# Patient Record
Sex: Female | Born: 1995 | Race: White | Hispanic: No | Marital: Single | Smoking: Never smoker
Health system: Southern US, Community
[De-identification: ages and names within clinical notes are randomized; demographics above are authoritative.]

## PROBLEM LIST (undated history)

## (undated) DIAGNOSIS — N3941 Urge incontinence: Secondary | ICD-10-CM

## (undated) DIAGNOSIS — Z973 Presence of spectacles and contact lenses: Secondary | ICD-10-CM

## (undated) DIAGNOSIS — K219 Gastro-esophageal reflux disease without esophagitis: Secondary | ICD-10-CM

## (undated) DIAGNOSIS — R300 Dysuria: Secondary | ICD-10-CM

## (undated) DIAGNOSIS — N201 Calculus of ureter: Secondary | ICD-10-CM

## (undated) DIAGNOSIS — R3915 Urgency of urination: Secondary | ICD-10-CM

## (undated) DIAGNOSIS — Z87442 Personal history of urinary calculi: Secondary | ICD-10-CM

## (undated) DIAGNOSIS — Z960 Presence of urogenital implants: Secondary | ICD-10-CM

---
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
## 2018-07-13 ENCOUNTER — Encounter (HOSPITAL_COMMUNITY): Payer: Self-pay | Admitting: *Deleted

## 2018-07-13 ENCOUNTER — Other Ambulatory Visit: Payer: Self-pay

## 2018-07-13 ENCOUNTER — Inpatient Hospital Stay (HOSPITAL_COMMUNITY)
Admission: AD | Admit: 2018-07-13 | Discharge: 2018-07-16 | DRG: 854 | Disposition: A | Discharge status: Home / Self Care | Payer: Medicaid Other | Source: Ambulatory Visit | Attending: Internal Medicine | Admitting: Internal Medicine

## 2018-07-13 ENCOUNTER — Inpatient Hospital Stay (HOSPITAL_COMMUNITY): Payer: Medicaid Other

## 2018-07-13 DIAGNOSIS — E278 Other specified disorders of adrenal gland: Secondary | ICD-10-CM | POA: Diagnosis present

## 2018-07-13 DIAGNOSIS — A419 Sepsis, unspecified organism: Secondary | ICD-10-CM | POA: Diagnosis present

## 2018-07-13 DIAGNOSIS — A4151 Sepsis due to Escherichia coli [E. coli]: Principal | ICD-10-CM | POA: Diagnosis present

## 2018-07-13 DIAGNOSIS — D72829 Elevated white blood cell count, unspecified: Secondary | ICD-10-CM | POA: Diagnosis present

## 2018-07-13 DIAGNOSIS — N1330 Unspecified hydronephrosis: Secondary | ICD-10-CM | POA: Diagnosis present

## 2018-07-13 DIAGNOSIS — N12 Tubulo-interstitial nephritis, not specified as acute or chronic: Secondary | ICD-10-CM | POA: Diagnosis not present

## 2018-07-13 DIAGNOSIS — N200 Calculus of kidney: Secondary | ICD-10-CM

## 2018-07-13 DIAGNOSIS — N201 Calculus of ureter: Secondary | ICD-10-CM | POA: Diagnosis present

## 2018-07-13 DIAGNOSIS — N136 Pyonephrosis: Secondary | ICD-10-CM | POA: Diagnosis present

## 2018-07-13 DIAGNOSIS — R220 Localized swelling, mass and lump, head: Secondary | ICD-10-CM | POA: Diagnosis not present

## 2018-07-13 DIAGNOSIS — T7840XA Allergy, unspecified, initial encounter: Secondary | ICD-10-CM

## 2018-07-13 DIAGNOSIS — N390 Urinary tract infection, site not specified: Secondary | ICD-10-CM | POA: Diagnosis not present

## 2018-07-13 DIAGNOSIS — R6520 Severe sepsis without septic shock: Secondary | ICD-10-CM | POA: Diagnosis present

## 2018-07-13 DIAGNOSIS — Z23 Encounter for immunization: Secondary | ICD-10-CM | POA: Diagnosis not present

## 2018-07-13 DIAGNOSIS — E279 Disorder of adrenal gland, unspecified: Secondary | ICD-10-CM | POA: Diagnosis not present

## 2018-07-13 DIAGNOSIS — T7840XS Allergy, unspecified, sequela: Secondary | ICD-10-CM | POA: Diagnosis not present

## 2018-07-13 LAB — COMPREHENSIVE METABOLIC PANEL
ALBUMIN: 3.6 g/dL (ref 3.5–5.0)
ALK PHOS: 55 U/L (ref 38–126)
ALT: 11 U/L (ref 0–44)
ANION GAP: 9 (ref 5–15)
AST: 15 U/L (ref 15–41)
BUN: 9 mg/dL (ref 6–20)
CHLORIDE: 108 mmol/L (ref 98–111)
CO2: 20 mmol/L — ABNORMAL LOW (ref 22–32)
Calcium: 7.9 mg/dL — ABNORMAL LOW (ref 8.9–10.3)
Creatinine, Ser: 0.71 mg/dL (ref 0.44–1.00)
GFR calc non Af Amer: >60 mL/min (ref >60)
GLUCOSE: 123 mg/dL — AB (ref 70–99)
POTASSIUM: 3.6 mmol/L (ref 3.5–5.1)
SODIUM: 137 mmol/L (ref 135–145)
Total Bilirubin: 1.2 mg/dL (ref 0.3–1.2)
Total Protein: 6.1 g/dL — ABNORMAL LOW (ref 6.5–8.1)

## 2018-07-13 LAB — URINALYSIS, ROUTINE W REFLEX MICROSCOPIC
BILIRUBIN URINE: NEGATIVE
Glucose, UA: NEGATIVE mg/dL
KETONES UR: 20 mg/dL — AB
Nitrite: POSITIVE — AB
Protein, ur: 100 mg/dL — AB
SPECIFIC GRAVITY, URINE: 1.02 (ref 1.005–1.030)
pH: 6 (ref 5.0–8.0)

## 2018-07-13 LAB — CBC WITH DIFFERENTIAL/PLATELET
BASOS ABS: 0 10*3/uL (ref 0.0–0.1)
BASOS PCT: 0 %
EOS ABS: 0 10*3/uL (ref 0.0–0.7)
Eosinophils Relative: 0 %
HCT: 35.4 % — ABNORMAL LOW (ref 36.0–46.0)
Hemoglobin: 12.2 g/dL (ref 12.0–15.0)
Lymphocytes Relative: 6 %
Lymphs Abs: 1.7 10*3/uL (ref 0.7–4.0)
MCH: 31 pg (ref 26.0–34.0)
MCHC: 34.5 g/dL (ref 30.0–36.0)
MCV: 90.1 fL (ref 78.0–100.0)
MONO ABS: 1.6 10*3/uL — AB (ref 0.1–1.0)
Monocytes Relative: 5 %
NEUTROS ABS: 27 10*3/uL — AB (ref 1.7–7.7)
NEUTROS PCT: 89 %
PLATELETS: 225 10*3/uL (ref 150–400)
RBC: 3.93 MIL/uL (ref 3.87–5.11)
RDW: 12.3 % (ref 11.5–15.5)
WBC: 30.3 10*3/uL — ABNORMAL HIGH (ref 4.0–10.5)

## 2018-07-13 LAB — POCT PREGNANCY, URINE: Preg Test, Ur: NEGATIVE

## 2018-07-13 LAB — APTT: APTT: 32 s (ref 24–36)

## 2018-07-13 LAB — PROTIME-INR
INR: 1.4
PROTHROMBIN TIME: 17 s — AB (ref 11.4–15.2)

## 2018-07-13 LAB — MRSA PCR SCREENING: MRSA by PCR: NEGATIVE

## 2018-07-13 LAB — LACTIC ACID, PLASMA
LACTIC ACID, VENOUS: 2.9 mmol/L — AB (ref 0.5–1.9)
LACTIC ACID, VENOUS: 1.4 mmol/L (ref 0.5–1.9)

## 2018-07-13 LAB — PROCALCITONIN: Procalcitonin: 7.82 ng/mL

## 2018-07-13 MED ORDER — SODIUM CHLORIDE 0.9 % IV SOLN
1.0000 g | Freq: Once | INTRAVENOUS | Status: AC
  Administered 2018-07-13: 1 g via INTRAVENOUS
  Filled 2018-07-13: qty 1

## 2018-07-13 MED ORDER — ACETAMINOPHEN 650 MG RE SUPP: 650.0000 mg | Freq: Four times a day (QID) | RECTAL | Status: DC | PRN

## 2018-07-13 MED ORDER — ACETAMINOPHEN 325 MG PO TABS
650.0000 mg | ORAL_TABLET | Freq: Four times a day (QID) | ORAL | Status: DC | PRN
  Administered 2018-07-13 – 2018-07-15 (×3): 650 mg via ORAL
  Filled 2018-07-13 (×3): qty 2

## 2018-07-13 MED ORDER — SODIUM CHLORIDE 0.9 % IV BOLUS
1000.0000 mL | Freq: Once | INTRAVENOUS | Status: AC
  Administered 2018-07-13: 1000 mL via INTRAVENOUS

## 2018-07-13 MED ORDER — SODIUM CHLORIDE 0.9 % IV BOLUS (SEPSIS)
500.0000 mL | Freq: Once | INTRAVENOUS | Status: AC
  Administered 2018-07-13: 500 mL via INTRAVENOUS

## 2018-07-13 MED ORDER — SODIUM CHLORIDE 0.9 % IV SOLN
INTRAVENOUS | Status: DC
  Administered 2018-07-13 – 2018-07-16 (×9): via INTRAVENOUS

## 2018-07-13 MED ORDER — SENNOSIDES-DOCUSATE SODIUM 8.6-50 MG PO TABS: 1.0000 | ORAL_TABLET | Freq: Every evening | ORAL | Status: DC | PRN

## 2018-07-13 MED ORDER — SODIUM CHLORIDE 0.9 % IV SOLN
1.0000 g | INTRAVENOUS | Status: DC
  Administered 2018-07-13: 1 g via INTRAVENOUS
  Filled 2018-07-13: qty 10

## 2018-07-13 MED ORDER — SODIUM CHLORIDE 0.9 % IV SOLN
INTRAVENOUS | Status: DC | PRN
  Administered 2018-07-13: 250 mL via INTRAVENOUS
  Administered 2018-07-16 (×2): 1000 mL via INTRAVENOUS

## 2018-07-13 MED ORDER — SODIUM CHLORIDE 0.9 % IV BOLUS: 1000.0000 mL | Freq: Once | INTRAVENOUS | Status: AC

## 2018-07-13 MED ORDER — SODIUM CHLORIDE 0.9 % IV BOLUS (SEPSIS)
250.0000 mL | Freq: Once | INTRAVENOUS | Status: AC
  Administered 2018-07-13: 250 mL via INTRAVENOUS

## 2018-07-13 MED ORDER — SODIUM CHLORIDE 0.9 % IV BOLUS (SEPSIS)
1000.0000 mL | Freq: Once | INTRAVENOUS | Status: AC
  Administered 2018-07-13: 1000 mL via INTRAVENOUS

## 2018-07-13 MED ORDER — SODIUM CHLORIDE 0.9 % IV SOLN
2.0000 g | INTRAVENOUS | Status: DC
  Administered 2018-07-14: 2 g via INTRAVENOUS
  Filled 2018-07-13 (×2): qty 20

## 2018-07-13 MED ORDER — ONDANSETRON HCL 4 MG/2ML IJ SOLN
4.0000 mg | Freq: Four times a day (QID) | INTRAMUSCULAR | Status: DC | PRN
  Administered 2018-07-13 – 2018-07-16 (×9): 4 mg via INTRAVENOUS
  Filled 2018-07-13 (×9): qty 2

## 2018-07-13 MED ORDER — ACETAMINOPHEN 500 MG PO TABS
1000.0000 mg | ORAL_TABLET | Freq: Once | ORAL | Status: AC
  Administered 2018-07-13: 1000 mg via ORAL
  Filled 2018-07-13: qty 2

## 2018-07-13 MED ORDER — IBUPROFEN 600 MG PO TABS
600.0000 mg | ORAL_TABLET | Freq: Once | ORAL | Status: AC
  Administered 2018-07-13: 600 mg via ORAL
  Filled 2018-07-13: qty 1

## 2018-07-13 MED ORDER — ONDANSETRON HCL 4 MG PO TABS: 4.0000 mg | ORAL_TABLET | Freq: Four times a day (QID) | ORAL | Status: DC | PRN

## 2018-07-13 MED ORDER — ONDANSETRON HCL 4 MG/2ML IJ SOLN
4.0000 mg | Freq: Once | INTRAMUSCULAR | Status: AC
  Administered 2018-07-13: 4 mg via INTRAVENOUS
  Filled 2018-07-13: qty 2

## 2018-07-13 MED ORDER — HYDROCODONE-ACETAMINOPHEN 5-325 MG PO TABS
1.0000 | ORAL_TABLET | ORAL | Status: DC | PRN
  Administered 2018-07-14 – 2018-07-16 (×8): 2 via ORAL
  Filled 2018-07-13 (×8): qty 2

## 2018-07-13 NOTE — H&P (Signed)
H&P        History and Physical    [name] GTX:[id number] DOB: 1996-01-10 DOA: 07/13/2018  PCP: Patient, No Pcp Per  Patient coming from: Ob traige  I have personally briefly reviewed patient's old medical records in [facility]  Chief Complaint: abd pain  HPI: Kendra Morgan is a 22 y.o. female no medical history presents with 2 days of abdominal pain and left flank pain.  Dysuria about 2 weeks ago and she treated herself with cranberry juice and some over-the-counter medications.  Past few days she has had severe low abdominal pain as well as left-sided back pain.   She treated herself with over-the-counter urinary medications however that caused lots of nausea and vomiting.  Patient was seen at [facility] triage and she was diagnosed with pyelonephritis.  She was given IV fluids and a dose of Rocephin.  Earlier today Dr. Rhona Leavens accepted patient for admission.  When she arrived here she was still tachycardic and her blood pressure was soft however she appeared in no acute distress.  Patient had a white count of 30,000.  Her initial lactic acid was 2.9 but repeat after fluid was down to 1.4.  UA was obviously infected.  Procalcitonin was elevated however her chest x-ray was nonacute.  EKG shows sinus tachycardia.  Patient denies any vaginal discharge she is however on her [period.  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  Her urine pregnancy is negative.  She is sexually  active with one partner, does not use condoms.   Review of Systems: Positive for dysuria abdominal pain left-sided back pain All others reviewed with patient  and are  negative unless otherwise stated  History reviewed. No pertinent past medical history.  Past Surgical History:  Procedure Laterality Date  . WISDOM TOOTH EXTRACTION       reports that she has never smoked. She has never used smokeless tobacco. She reports that she does not drink alcohol or use drugs.  No Known Allergies  Family  History  Problem Relation Age of Onset  . Urinary tract infection Mother      Prior to Admission medications   Medication Sig Start Date End Date Taking? Authorizing Provider  acetaminophen (TYLENOL) 500 MG tablet Take 500 mg by mouth every 6 (six) hours as needed for moderate pain.   Yes [provider]  ibuprofen (ADVIL,MOTRIN) 200 MG tablet Take 200 mg by mouth every 6 (six) hours as needed for moderate pain.   Yes [provider]  phenazopyridine (URISTAT) 95 MG tablet Take 95 mg by mouth 3 (three) times daily as needed for pain.   Yes [provider]    Physical Exam: Vitals:   07/13/18 1714 07/13/18 1716 07/13/18 1812 07/13/18 2000  BP: (!) 91/43 (!) 91/43 (!) 86/40 (!) 75/47  Pulse:  (!) 129 (!) 125 (!) 121  Resp:  20 18 20   Temp: (!) 100.9 F (38.3 C) (!) 100.9 F (38.3 C) 99.6 F (37.6 C) 98.8 F (37.1 C)  TempSrc:   Oral Oral  SpO2:  99% 96% 99%  Weight:   55 kg   Height:   4\' 11"  (1.499 m)     Constitutional: NAD, calm, comfortable Vitals:   07/13/18 1714 07/13/18 1716 07/13/18 1812 07/13/18 2000  BP: (!) 91/43 (!) 91/43 (!) 86/40 (!) 75/47  Pulse:  (!) 129 (!) 125 (!) 121  Resp:  20 18 20   Temp: (!) 100.9 F (38.3 C) (!)  100.9 F (38.3 C) 99.6 F (37.6 C) 98.8 F (37.1 C)  TempSrc:   Oral Oral  SpO2:  99% 96% 99%  Weight:   55 kg   Height:   4\' 11"  (1.499 m)    Eyes: PERRL, lids and conjunctivae normal ENMT: Mucous membranes are moist. Posterior pharynx clear of any exudate or lesions.Normal dentition.  Neck: normal, supple, no masses, no thyromegaly Respiratory: clear to auscultation bilaterally, no wheezing, no crackles. Normal respiratory effort. No accessory muscle use.  Cardiovascular: Tachy rate and reg  rhythm, no murmurs / rubs / gallops. No extremity edema. 2+ pedal pulses. .  Abdomen: min lower abd  tenderness, no masses palpated. No hepatosplenomegaly. Bowel sounds positive.  Musculoskeletal: no clubbing / cyanosis.  No joint deformity upper and lower extremities. Good ROM, no contractures. Normal muscle tone.  Skin: no rashes, lesions, ulcers. No induration Neurologic: CN 2-12 grossly intact. Sensation intact, . Strength 5/5 in all 4.  Psychiatric: Normal judgment and insight. Alert and oriented x 3. Normal mood.   Labs on Admission: I have personally reviewed following labs and imaging studies  CBC: Recent Labs  Lab 07/13/18 1241  WBC 30.3*  NEUTROABS 27.0*  HGB 12.2  HCT 35.4*  MCV 90.1  PLT 225   Basic Metabolic Panel: Recent Labs  Lab 07/13/18 1337  NA 137  K 3.6  CL 108  CO2 20*  GLUCOSE 123*  BUN 9  CREATININE 0.71  CALCIUM 7.9*   GFR: Estimated Creatinine Clearance: 84.8 mL/min (by C-G formula based on SCr of 0.71 mg/dL). Liver Function Tests: Recent Labs  Lab 07/13/18 1337  AST 15  ALT 11  ALKPHOS 55  BILITOT 1.2  PROT 6.1*  ALBUMIN 3.6   No results for input(s): LIPASE, AMYLASE in the last 168 hours. No results for input(s): AMMONIA in the last 168 hours. Coagulation Profile: Recent Labs  Lab 07/13/18 1337  INR 1.40   Cardiac Enzymes: No results for input(s): CKTOTAL, CKMB, CKMBINDEX, TROPONINI in the last 168 hours. BNP (last 3 results) No results for input(s): PROBNP in the last 8760 hours. HbA1C: No results for input(s): HGBA1C in the last 72 hours. CBG: No results for input(s): GLUCAP in the last 168 hours. Lipid Profile: No results for input(s): CHOL, HDL, LDLCALC, TRIG, CHOLHDL, LDLDIRECT in the last 72 hours. Thyroid Function Tests: No results for input(s): TSH, T4TOTAL, FREET4, T3FREE, THYROIDAB in the last 72 hours. Anemia Panel: No results for input(s): VITAMINB12, FOLATE, FERRITIN, TIBC, IRON, RETICCTPCT in the last 72 hours. Urine analysis:    Component Value Date/Time   COLORURINE AMBER (A) 07/13/2018 1209   APPEARANCEUR HAZY (A) 07/13/2018 1209   LABSPEC 1.020 07/13/2018 1209   PHURINE 6.0 07/13/2018 1209   GLUCOSEU NEGATIVE  07/13/2018 1209   HGBUR SMALL (A) 07/13/2018 1209   BILIRUBINUR NEGATIVE 07/13/2018 1209   KETONESUR 20 (A) 07/13/2018 1209   PROTEINUR 100 (A) 07/13/2018 1209   UROBILINOGEN 0.2 04/12/2011 1441   NITRITE POSITIVE (A) 07/13/2018 1209   LEUKOCYTESUR SMALL (A) 07/13/2018 1209    Radiological Exams on Admission: Dg Chest Port 1 View  Result Date: 07/13/2018 CLINICAL DATA:  Patient with fever. EXAM: PORTABLE CHEST 1 VIEW COMPARISON:  None. FINDINGS: Normal cardiac and mediastinal contours. No consolidative pulmonary opacities. No pleural effusion or pneumothorax. IMPRESSION: No acute cardiopulmonary process. Electronically Signed   By: Annia Belt M.D.   On: 07/13/2018 14:12    EKG: Independently reviewed.  Sinus tachycardia otherwise normal EKG  Assessment/Plan  Active Problems:   Sepsis secondary to UTI [facility])   Pyelonephritis   -Inpatient admission to stepdown unit.  Patient received her IV fluid bolus with continued tachycardia but improved.  blood pressure still borderline normal, but lactic acid down to normal range.  Suspect her baseline blood pressure is low. patient is asymptomatic at this time.  Continue with Rocephin IV follow blood and urine cultures.  Check CT of the abdomen and pelvis.  HIV pending.  DVT prophylaxis: Early ambulation* Code Status: Full* Family Communication: Discussed case with both her mother and grandmother at bedside Disposition Plan: Discharge home 2 to 3 days *Admission status: Patient stepdown unit  Riddick Nuon Johnson-Pitts MD [facility] Pager [phone]  If 7PM-7AM, please contact night-coverage [email or web address] Password [facility]  07/13/2018, 9:31 PM

## 2018-07-13 NOTE — MAU Note (Signed)
So has had a UTI for like 2 wks. Started taking uristat yesterday.  Having cramping in lower abd and low back about 2 days ago.  Frequency, urgency and painful urination. Feels hot, shaking and has been throwing up.  +left CVA pain.

## 2018-07-13 NOTE — Progress Notes (Signed)
Have paged Dr. Rhona Leavens three times to notify him that the patient from [facility] is here in 1231.  No response from Dr. Rhona Leavens at this time.  Will page one more time to notify him that the patient is here in 1231.  Shayda Kalka Debroah Loop RN

## 2018-07-13 NOTE — MAU Provider Note (Signed)
Chief Complaint: Dysuria; Abdominal Pain; and Back Pain   First Provider Initiated Contact with Patient 07/13/18 1254     SUBJECTIVE HPI: Kendra Morgan is a 22 y.o. No obstetric history on file. at Unknown who presents to Maternity Admissions reporting n/v, dysuria, & flank pain. Symptoms began 2 weeks ago with dysuria but worsened yesterday. Has had chills at home but didn't check her temp. Has been taking advil without relief and now having nausea & vomiting. Hx of UTI when she was 18 but otherwise denies hx of kidney issues. Does not have a PCP.   Location: left flank Quality: sore Severity: 9/10 on pain scale Duration: 2 days Timing: constant Modifying factors: not improved with advil Associated signs and symptoms: n/v, chills, dysuria  History reviewed. No pertinent past medical history. OB History  No data available   Past Surgical History:  Procedure Laterality Date  . WISDOM TOOTH EXTRACTION     Social History   Socioeconomic History  . Marital status: Single    Spouse name: Not on file  . Number of children: Not on file  . Years of education: Not on file  . Highest education level: Not on file  Occupational History  . Not on file  Social Needs  . Financial resource strain: Not on file  . Food insecurity:    Worry: Not on file    Inability: Not on file  . Transportation needs:    Medical: Not on file    Non-medical: Not on file  Tobacco Use  . Smoking status: Never Smoker  . Smokeless tobacco: Never Used  Substance and Sexual Activity  . Alcohol use: Never    Frequency: Never  . Drug use: Never  . Sexual activity: Yes  Lifestyle  . Physical activity:    Days per week: Not on file    Minutes per session: Not on file  . Stress: Not on file  Relationships  . Social connections:    Talks on phone: Not on file    Gets together: Not on file    Attends religious service: Not on file    Active member of club or organization: Not on file    Attends meetings  of clubs or organizations: Not on file    Relationship status: Not on file  . Intimate partner violence:    Fear of current or ex partner: Not on file    Emotionally abused: Not on file    Physically abused: Not on file    Forced sexual activity: Not on file  Other Topics Concern  . Not on file  Social History Narrative  . Not on file   No family history on file. No current facility-administered medications on file prior to encounter.    No current outpatient medications on file prior to encounter.   No Known Allergies  I have reviewed patient's Past Medical Hx, Surgical Hx, Family Hx, Social Hx, medications and allergies.   Review of Systems  Constitutional: Positive for chills.  Gastrointestinal: Positive for abdominal pain, nausea and vomiting. Negative for constipation and diarrhea.  Genitourinary: Positive for dysuria, flank pain, frequency, hematuria (unsure if vaginal or urinary) and vaginal bleeding (currently on menses).    OBJECTIVE Patient Vitals for the past 24 hrs:  BP Temp Temp src Pulse Resp SpO2 Weight  07/13/18 1530 (!) 102/46 (!) 100.5 F (38.1 C) Axillary (!) 136 (!) 22 98 % -  07/13/18 1503 (!) 97/53 99.3 F (37.4 C) Axillary (!) 122 18 - -  07/13/18 1425 - - - - - 97 % -  07/13/18 1420 - - - - - 98 % -  07/13/18 1415 (!) 88/47 - - (!) 121 - 97 % -  07/13/18 1410 - - - - - 97 % -  07/13/18 1407 (!) 89/42 100 F (37.8 C) Oral (!) 122 20 - -  07/13/18 1405 - - - - - 97 % -  07/13/18 1400 - - - - - 96 % -  07/13/18 1350 - - - - - 96 % -  07/13/18 1345 - - - - - 96 % -  07/13/18 1340 - - - - - 97 % -  07/13/18 1330 - - - - - 97 % -  07/13/18 1325 - - - - - 94 % -  07/13/18 1320 - - - - - 98 % -  07/13/18 1315 - - - - - 94 % -  07/13/18 1310 - - - - - 97 % -  07/13/18 1305 - - - - - 98 % -  07/13/18 1300 - - - - - 96 % -  07/13/18 1259 - (!) 102.5 F (39.2 C) Oral (!) 127 20 - -  07/13/18 1235 - - - - - 100 % -  07/13/18 1231 (!) 105/51 - - (!)  122 - - -  07/13/18 1152 (!) 108/59 (!) 102.6 F (39.2 C) Oral (!) 141 (!) 22 98 % 54 kg   Constitutional: Well-developed, well-nourished female. Appears ill.   Cardiovascular: tachycardia & rhythm, no murmur Respiratory: normal rate and effort. Lung sounds clear throughout GI: Abd soft, non-tender, Pos BS x 4. No guarding or rebound tenderness. + left CVAT MS: Extremities nontender, no edema, normal ROM Neurologic: Alert and oriented x 4.     LAB RESULTS Results for orders placed or performed during the hospital encounter of 07/13/18 (from the past 24 hour(s))  Lactic acid, plasma     Status: Abnormal   Collection Time: 07/13/18 11:58 AM  Result Value Ref Range   Lactic Acid, Venous 2.9 (HH) 0.5 - 1.9 mmol/L  Urinalysis, Routine w reflex microscopic     Status: Abnormal   Collection Time: 07/13/18 12:09 PM  Result Value Ref Range   Color, Urine AMBER (A) YELLOW   APPearance HAZY (A) CLEAR   Specific Gravity, Urine 1.020 1.005 - 1.030   pH 6.0 5.0 - 8.0   Glucose, UA NEGATIVE NEGATIVE mg/dL   Hgb urine dipstick SMALL (A) NEGATIVE   Bilirubin Urine NEGATIVE NEGATIVE   Ketones, ur 20 (A) NEGATIVE mg/dL   Protein, ur 945 (A) NEGATIVE mg/dL   Nitrite POSITIVE (A) NEGATIVE   Leukocytes, UA SMALL (A) NEGATIVE   RBC / HPF 11-20 0 - 5 RBC/hpf   WBC, UA >50 (H) 0 - 5 WBC/hpf   Bacteria, UA MANY (A) NONE SEEN   Squamous Epithelial / LPF 0-5 0 - 5   Mucus PRESENT    Non Squamous Epithelial 0-5 (A) NONE SEEN  Pregnancy, urine POC     Status: None   Collection Time: 07/13/18 12:12 PM  Result Value Ref Range   Preg Test, Ur NEGATIVE NEGATIVE    IMAGING No results found.  MAU COURSE Orders Placed This Encounter  Procedures  . Urinalysis, Routine w reflex microscopic  . CBC with Differential  . Lactic acid, plasma  . CBC with Differential/Platelet  . Pregnancy, urine POC   Meds ordered this encounter  Medications  . sodium chloride 0.9 %  bolus 1,000 mL  . acetaminophen  (TYLENOL) tablet 1,000 mg  . ondansetron (ZOFRAN) injection 4 mg    MDM UPT negative IV normal saline started. CBC & lactic acid pending Pt given zofran & tylenol U/a + nitrites, urine culture ordered Lactic acid= 2.9. Dr. Emelda Fear notified & will come see patient. Will order IV rocephin pending blood culture being drawn  Low BP reported by RN. Pt completed bolus of 3 liters of NS & now 125/hr & rocephin has been given. SBP improved, DBP remains low & pt tachycardic.  Repeat lactic acid down to 1.4   ASSESSMENT 1. Pyelonephritis   2. Sepsis [facility])     PLAN Transfer per Dr. Ronalee Red, Denny Peon, NP 07/13/2018  1:04 PM

## 2018-07-13 NOTE — MAU Note (Signed)
Critical value of lactic acid 2.9 called by lab, informed Estanislado Spire NP at [employer]

## 2018-07-13 NOTE — Progress Notes (Signed)
I was contacted by nursing staff as patient continued to be tachycardic and had a low bp.  sHe also complained of pain earlier  and received Tylenol.  I went  to personally evaluate patient and she is resting comfortably.  She states her pain is improved since taking Tylenol.  Her pulse is around 118.  Her map at this time is 8.  Satting well  On RA. not complaining of any shortness of breath or chest pain.  CT of the abdomen and pelvis is pending for this evening.  Updated nurse and patients mother  at bedside

## 2018-07-14 ENCOUNTER — Inpatient Hospital Stay (HOSPITAL_COMMUNITY): Payer: Medicaid Other

## 2018-07-14 ENCOUNTER — Encounter (HOSPITAL_COMMUNITY)
Admission: AD | Disposition: A | Discharge status: Home / Self Care | Payer: Self-pay | Source: Ambulatory Visit | Attending: Internal Medicine

## 2018-07-14 ENCOUNTER — Encounter (HOSPITAL_COMMUNITY): Payer: Self-pay

## 2018-07-14 ENCOUNTER — Inpatient Hospital Stay (HOSPITAL_COMMUNITY): Payer: Medicaid Other | Admitting: Registered Nurse

## 2018-07-14 DIAGNOSIS — A419 Sepsis, unspecified organism: Secondary | ICD-10-CM | POA: Diagnosis present

## 2018-07-14 DIAGNOSIS — N1330 Unspecified hydronephrosis: Secondary | ICD-10-CM

## 2018-07-14 DIAGNOSIS — D72829 Elevated white blood cell count, unspecified: Secondary | ICD-10-CM | POA: Diagnosis present

## 2018-07-14 DIAGNOSIS — N201 Calculus of ureter: Secondary | ICD-10-CM | POA: Diagnosis present

## 2018-07-14 DIAGNOSIS — N390 Urinary tract infection, site not specified: Secondary | ICD-10-CM

## 2018-07-14 HISTORY — PX: CYSTOSCOPY W/ URETERAL STENT PLACEMENT: SHX1429

## 2018-07-14 LAB — CBC
HCT: 34.7 % — ABNORMAL LOW (ref 36.0–46.0)
Hemoglobin: 11.7 g/dL — ABNORMAL LOW (ref 12.0–15.0)
MCH: 30.7 pg (ref 26.0–34.0)
MCHC: 33.7 g/dL (ref 30.0–36.0)
MCV: 91.1 fL (ref 78.0–100.0)
PLATELETS: 199 10*3/uL (ref 150–400)
RBC: 3.81 MIL/uL — AB (ref 3.87–5.11)
RDW: 12.8 % (ref 11.5–15.5)
WBC: 27.9 10*3/uL — ABNORMAL HIGH (ref 4.0–10.5)

## 2018-07-14 LAB — BASIC METABOLIC PANEL
Anion gap: 8 (ref 5–15)
BUN: 8 mg/dL (ref 6–20)
CHLORIDE: 115 mmol/L — AB (ref 98–111)
CO2: 18 mmol/L — AB (ref 22–32)
CREATININE: 0.77 mg/dL (ref 0.44–1.00)
Calcium: 7.5 mg/dL — ABNORMAL LOW (ref 8.9–10.3)
GFR calc non Af Amer: >60 mL/min (ref >60)
GLUCOSE: 130 mg/dL — AB (ref 70–99)
Potassium: 3.3 mmol/L — ABNORMAL LOW (ref 3.5–5.1)
Sodium: 141 mmol/L (ref 135–145)

## 2018-07-14 LAB — HIV ANTIBODY (ROUTINE TESTING W REFLEX): HIV Screen 4th Generation wRfx: NONREACTIVE

## 2018-07-14 SURGERY — CYSTOSCOPY, WITH RETROGRADE PYELOGRAM AND URETERAL STENT INSERTION
Anesthesia: General | Laterality: Bilateral

## 2018-07-14 MED ORDER — SODIUM CHLORIDE 0.9 % IR SOLN
Status: DC | PRN
  Administered 2018-07-14: 500 mL

## 2018-07-14 MED ORDER — LACTATED RINGERS IV SOLN
INTRAVENOUS | Status: DC
  Administered 2018-07-14: 10:00:00 via INTRAVENOUS

## 2018-07-14 MED ORDER — SUCCINYLCHOLINE CHLORIDE 200 MG/10ML IV SOSY
PREFILLED_SYRINGE | INTRAVENOUS | Status: AC
  Filled 2018-07-14: qty 10

## 2018-07-14 MED ORDER — MEPERIDINE HCL 50 MG/ML IJ SOLN: 6.2500 mg | INTRAMUSCULAR | Status: DC | PRN

## 2018-07-14 MED ORDER — MIDAZOLAM HCL 2 MG/2ML IJ SOLN
INTRAMUSCULAR | Status: AC
  Filled 2018-07-14: qty 2

## 2018-07-14 MED ORDER — SODIUM CHLORIDE 0.9 % IR SOLN
Status: DC | PRN
  Administered 2018-07-14: 1000 mL

## 2018-07-14 MED ORDER — PHENYLEPHRINE 40 MCG/ML (10ML) SYRINGE FOR IV PUSH (FOR BLOOD PRESSURE SUPPORT)
PREFILLED_SYRINGE | INTRAVENOUS | Status: DC | PRN
  Administered 2018-07-14 (×4): 80 ug via INTRAVENOUS

## 2018-07-14 MED ORDER — PROPOFOL 10 MG/ML IV BOLUS
INTRAVENOUS | Status: DC | PRN
  Administered 2018-07-14: 120 mg via INTRAVENOUS

## 2018-07-14 MED ORDER — LACTATED RINGERS IV SOLN: INTRAVENOUS | Status: DC

## 2018-07-14 MED ORDER — LIDOCAINE 2% (20 MG/ML) 5 ML SYRINGE
INTRAMUSCULAR | Status: DC | PRN
  Administered 2018-07-14: 80 mg via INTRAVENOUS

## 2018-07-14 MED ORDER — HYDROMORPHONE HCL 1 MG/ML IJ SOLN: 0.2500 mg | INTRAMUSCULAR | Status: DC | PRN

## 2018-07-14 MED ORDER — PHENYLEPHRINE 40 MCG/ML (10ML) SYRINGE FOR IV PUSH (FOR BLOOD PRESSURE SUPPORT)
PREFILLED_SYRINGE | INTRAVENOUS | Status: AC
  Filled 2018-07-14: qty 10

## 2018-07-14 MED ORDER — FENTANYL CITRATE (PF) 100 MCG/2ML IJ SOLN
INTRAMUSCULAR | Status: AC
  Filled 2018-07-14: qty 2

## 2018-07-14 MED ORDER — MIDAZOLAM HCL 5 MG/5ML IJ SOLN
INTRAMUSCULAR | Status: DC | PRN
  Administered 2018-07-14: 2 mg via INTRAVENOUS

## 2018-07-14 MED ORDER — DEXAMETHASONE SODIUM PHOSPHATE 10 MG/ML IJ SOLN
INTRAMUSCULAR | Status: DC | PRN
  Administered 2018-07-14: 8 mg via INTRAVENOUS

## 2018-07-14 MED ORDER — SODIUM CHLORIDE 0.9 % IV BOLUS
1000.0000 mL | Freq: Once | INTRAVENOUS | Status: AC
  Administered 2018-07-14: 1000 mL via INTRAVENOUS

## 2018-07-14 MED ORDER — ONDANSETRON HCL 4 MG/2ML IJ SOLN
INTRAMUSCULAR | Status: AC
  Filled 2018-07-14: qty 2

## 2018-07-14 MED ORDER — IOHEXOL 300 MG/ML  SOLN
100.0000 mL | Freq: Once | INTRAMUSCULAR | Status: AC | PRN
  Administered 2018-07-14: 100 mL via INTRAVENOUS

## 2018-07-14 MED ORDER — FENTANYL CITRATE (PF) 100 MCG/2ML IJ SOLN
INTRAMUSCULAR | Status: DC | PRN
  Administered 2018-07-14: 50 ug via INTRAVENOUS
  Administered 2018-07-14: 25 ug via INTRAVENOUS

## 2018-07-14 MED ORDER — IOPAMIDOL (ISOVUE-300) INJECTION 61%
INTRAVENOUS | Status: DC | PRN
  Administered 2018-07-14: 7 mL via URETHRAL

## 2018-07-14 MED ORDER — SUCCINYLCHOLINE CHLORIDE 200 MG/10ML IV SOSY
PREFILLED_SYRINGE | INTRAVENOUS | Status: DC | PRN
  Administered 2018-07-14: 10 mg via INTRAVENOUS

## 2018-07-14 MED ORDER — PROMETHAZINE HCL 25 MG/ML IJ SOLN: 6.2500 mg | INTRAMUSCULAR | Status: DC | PRN

## 2018-07-14 MED ORDER — ONDANSETRON HCL 4 MG/2ML IJ SOLN
INTRAMUSCULAR | Status: DC | PRN
  Administered 2018-07-14: 4 mg via INTRAVENOUS

## 2018-07-14 SURGICAL SUPPLY — 13 items
BAG URO CATCHER STRL LF (MISCELLANEOUS) ×2 IMPLANT
BASKET ZERO TIP NITINOL 2.4FR (BASKET) IMPLANT
CATH INTERMIT  6FR 70CM (CATHETERS) IMPLANT
CLOTH BEACON ORANGE TIMEOUT ST (SAFETY) ×2 IMPLANT
GLOVE BIOGEL M STRL SZ7.5 (GLOVE) ×2 IMPLANT
GOWN STRL REUS W/TWL LRG LVL3 (GOWN DISPOSABLE) ×4 IMPLANT
GUIDEWIRE ANG ZIPWIRE 038X150 (WIRE) ×2 IMPLANT
GUIDEWIRE STR DUAL SENSOR (WIRE) IMPLANT
MANIFOLD NEPTUNE II (INSTRUMENTS) ×2 IMPLANT
PACK CYSTO (CUSTOM PROCEDURE TRAY) ×2 IMPLANT
STENT POLARIS 5FRX22 (STENTS) ×4 IMPLANT
TRAY FOLEY CATH 14FR (SET/KITS/TRAYS/PACK) ×2 IMPLANT
TUBING CONNECTING 10 (TUBING) ×2 IMPLANT

## 2018-07-14 NOTE — Anesthesia Preprocedure Evaluation (Addendum)
Anesthesia Evaluation  Patient identified by MRN, date of birth, ID band Patient awake    Reviewed: Allergy & Precautions, NPO status , Patient's Chart, lab work & pertinent test results  Airway Mallampati: II  TM Distance: >3 FB Neck ROM: Full    Dental  (+) Teeth Intact, Dental Advisory Given   Pulmonary neg pulmonary ROS,    breath sounds clear to auscultation       Cardiovascular negative cardio ROS   Rhythm:Regular Rate:Tachycardia     Neuro/Psych negative neurological ROS     GI/Hepatic negative GI ROS, Neg liver ROS,   Endo/Other  negative endocrine ROS  Renal/GU      Musculoskeletal negative musculoskeletal ROS (+)   Abdominal Normal abdominal exam  (+)   Peds  Hematology negative hematology ROS (+)   Anesthesia Other Findings   Reproductive/Obstetrics                           Lab Results  Component Value Date   WBC 27.9 (H) 07/14/2018   HGB 11.7 (L) 07/14/2018   HCT 34.7 (L) 07/14/2018   MCV 91.1 07/14/2018   PLT 199 07/14/2018   Lab Results  Component Value Date   CREATININE 0.77 07/14/2018   BUN 8 07/14/2018   NA 141 07/14/2018   K 3.3 (L) 07/14/2018   CL 115 (H) 07/14/2018   CO2 18 (L) 07/14/2018   Lab Results  Component Value Date   INR 1.40 07/13/2018   EKG: sinus tachycardia.   Anesthesia Physical Anesthesia Plan  ASA: II  Anesthesia Plan: General   Post-op Pain Management:    Induction: Intravenous, Rapid sequence and Cricoid pressure planned  PONV Risk Score and Plan: 4 or greater and Ondansetron, Midazolam, Dexamethasone and Scopolamine patch - Pre-op  Airway Management Planned: Oral ETT  Additional Equipment: None  Intra-op Plan:   Post-operative Plan: Extubation in OR  Informed Consent: I have reviewed the patients History and Physical, chart, labs and discussed the procedure including the risks, benefits and alternatives for the  proposed anesthesia with the patient or authorized representative who has indicated his/her understanding and acceptance.   Dental advisory given  Plan Discussed with: CRNA  Anesthesia Plan Comments:         Anesthesia Quick Evaluation

## 2018-07-14 NOTE — Care Management Note (Signed)
Case Management Note  Patient Details  Name: Kendra Morgan MRN: [id number] Date of Birth: 1996/09/19  Subjective/Objective:                  22 year old female with no past medical history presented with abdominal pain and left flank pain for 2 weeks for which she was treating herself with over-the-counter urinary medications.  She was found to have sepsis secondary to pyelonephritis and started on intravenous antibiotics. Assessment & Plan:  Active Problems:   Sepsis secondary to UTI (HCC)   Pyelonephritis Sepsis secondary to pyelonephritis -Antibiotic plan as below.  Still tachycardic.  Continue IV fluids.  Follow cultures.  Blood pressure much better this morning. Acute pyelonephritis with bilateral UVJ stones with bilateral mild hydronephrosis -Continue antibiotics.  Pain management. -Spoke to Dr. Manny/urology who will see the patient in consultation: Patient might need cystoscopy and stenting.  Keep the patient n.p.o.   Action/Plan: Goal Length of Stay: Ambulatory or 2 days  Note: Goal Length of Stay assumes optimal recovery, decision making, and care. Patients may be discharged to a lower level of care (either later than or sooner than the goal) when it is appropriate for their clinical status and care needs. Discharge Readiness Return to top of Urinary Tract Infection (UTI) RRG - ISC  Discharge readiness is indicated by patient meeting Recovery Milestones, including ALL of the following: ? Hemodynamic stability=Temp=102.8/ hr=141-156/resp 40 ? Fever absent or reduced no temp102.8 ? Vomiting absent or improved/ absent ? Urine output adequate yes ? Renal function at baseline or acceptable for next level of care yes ? Pain absent or managed/ managed ? Ambulatory/ in room ? Oral hydration, medications,[L] and diet ? Iv ns at 125cc/hrs,iv rocephin ? Not ready for dc may not be ready for next level   Expected Discharge Date:  07/16/18               Expected Discharge Plan:   Home/Self Care  In-House Referral:     Discharge planning Services  CM Consult  Post Acute Care Choice:    Choice offered to:     DME Arranged:    DME Agency:     HH Arranged:    HH Agency:     Status of Service:  In process, will continue to follow  If discussed at Long Length of Stay Meetings, dates discussed:    Additional Comments:  Golda Acre, RN 07/14/2018, 9:52 AM

## 2018-07-14 NOTE — Progress Notes (Signed)
Patient ID: Kendra Morgan, female   DOB: 1996/06/29, 22 y.o.   MRN: [id number]  PROGRESS NOTE    Kendra Morgan  WUJ:[id number] DOB: October 23, 1996 DOA: 07/13/2018 PCP: Patient, No Pcp Per   Brief Narrative:  22 year old female with no past medical history presented with abdominal pain and left flank pain for 2 weeks for which she was treating herself with over-the-counter urinary medications.  She was found to have sepsis secondary to pyelonephritis and started on intravenous antibiotics.   Assessment & Plan:   Active Problems:   Sepsis secondary to UTI (HCC)   Pyelonephritis   Sepsis secondary to pyelonephritis -Antibiotic plan as below.  Still tachycardic.  Continue IV fluids.  Follow cultures.  Blood pressure much better this morning.  Acute pyelonephritis with bilateral UVJ stones with bilateral mild hydronephrosis -Continue antibiotics.  Pain management. -Spoke to Dr. Manny/urology who will see the patient in consultation: Patient might need cystoscopy and stenting.  Keep the patient n.p.o.  Leukocytosis -Probably secondary to above.  Improving.  Repeat a.m. labs  15 mm enhancing lesion in the right lobe of liver and left adrenal nodule -Outpatient follow-up   DVT prophylaxis: Early ambulation Code Status: Full Family Communication: Spoke to mother at bedside Disposition Plan: Depends on clinical outcome  Consultants: Urology  Procedures: None  Antimicrobials: Rocephin from 07/13/2018 onwards   Subjective: Patient seen and examined at bedside.  She still complains of some abdominal pain with nausea.  She is still having temperature spikes.  No current vomiting.  Objective: Vitals:   07/13/18 2000 07/14/18 0000 07/14/18 0400 07/14/18 0510  BP: (!) 75/47 (!) 96/33 (!) 140/91 112/82  Pulse: (!) 121 (!) 118 (!) 140 (!) 138  Resp: 20 19 20  (!) 40  Temp: 98.8 F (37.1 C) 99 F (37.2 C) 99.4 F (37.4 C)   TempSrc: Oral Oral Oral   SpO2: 99% 96% 100% 100%  Weight:        Height:        Intake/Output Summary (Last 24 hours) at 07/14/2018 0834 Last data filed at 07/14/2018 0811 Gross per 24 hour  Intake 2887.23 ml  Output 250 ml  Net 2637.23 ml   Filed Weights   07/13/18 1152 07/13/18 1812  Weight: 54 kg 55 kg    Examination:  General exam: Appears calm and comfortable.  No distress Respiratory system: Bilateral decreased breath sounds at bases Cardiovascular system: S1 & S2 heard, tachycardic Gastrointestinal system: Abdomen is nondistended, soft and tender in the left lower quadrant. Normal bowel sounds heard. Extremities: No cyanosis, clubbing, edema  Central nervous system: Alert and oriented. No focal neurological deficits. Moving extremities Skin: No rashes, lesions or ulcers Psychiatry: Judgement and insight appear normal. Mood & affect appropriate.     Data Reviewed: I have personally reviewed following labs and imaging studies  CBC: Recent Labs  Lab 07/13/18 1241 07/14/18 0359  WBC 30.3* 27.9*  NEUTROABS 27.0*  --   HGB 12.2 11.7*  HCT 35.4* 34.7*  MCV 90.1 91.1  PLT 225 199   Basic Metabolic Panel: Recent Labs  Lab 07/13/18 1337 07/14/18 0359  NA 137 141  K 3.6 3.3*  CL 108 115*  CO2 20* 18*  GLUCOSE 123* 130*  BUN 9 8  CREATININE 0.71 0.77  CALCIUM 7.9* 7.5*   GFR: Estimated Creatinine Clearance: 84.8 mL/min (by C-G formula based on SCr of 0.77 mg/dL). Liver Function Tests: Recent Labs  Lab 07/13/18 1337  AST 15  ALT 11  ALKPHOS  55  BILITOT 1.2  PROT 6.1*  ALBUMIN 3.6   No results for input(s): LIPASE, AMYLASE in the last 168 hours. No results for input(s): AMMONIA in the last 168 hours. Coagulation Profile: Recent Labs  Lab 07/13/18 1337  INR 1.40   Cardiac Enzymes: No results for input(s): CKTOTAL, CKMB, CKMBINDEX, TROPONINI in the last 168 hours. BNP (last 3 results) No results for input(s): PROBNP in the last 8760 hours. HbA1C: No results for input(s): HGBA1C in the last 72 hours. CBG: No  results for input(s): GLUCAP in the last 168 hours. Lipid Profile: No results for input(s): CHOL, HDL, LDLCALC, TRIG, CHOLHDL, LDLDIRECT in the last 72 hours. Thyroid Function Tests: No results for input(s): TSH, T4TOTAL, FREET4, T3FREE, THYROIDAB in the last 72 hours. Anemia Panel: No results for input(s): VITAMINB12, FOLATE, FERRITIN, TIBC, IRON, RETICCTPCT in the last 72 hours. Sepsis Labs: Recent Labs  Lab 07/13/18 1158 07/13/18 1337 07/13/18 1505  PROCALCITON  --  7.82  --   LATICACIDVEN 2.9*  --  1.4    Recent Results (from the past 240 hour(s))  MRSA PCR Screening     Status: None   Collection Time: 07/13/18  6:09 PM  Result Value Ref Range Status   MRSA by PCR NEGATIVE NEGATIVE Final    Comment:        The GeneXpert MRSA Assay (FDA approved for NASAL specimens only), is one component of a comprehensive MRSA colonization surveillance program. It is not intended to diagnose MRSA infection nor to guide or monitor treatment for MRSA infections. Performed at [facility], [address], [place], [place] [address]          Radiology Studies: Ct Abdomen Pelvis W Contrast  Result Date: 07/14/2018 CLINICAL DATA:  23 year old female with pyelonephritis, abdominal pain, nausea vomiting. EXAM: CT ABDOMEN AND PELVIS WITH CONTRAST TECHNIQUE: Multidetector CT imaging of the abdomen and pelvis was performed using the standard protocol following bolus administration of intravenous contrast. CONTRAST:  OMNIPAQUE IOHEXOL 300 MG/ML  SOLN COMPARISON:  None. FINDINGS: Lower chest: There are partially visualized small bilateral pleural effusions and associated partial subsegmental atelectasis of the lung bases. There is no intra-abdominal free air. There is moderate free fluid within the pelvis as well as small upper abdominal and perihepatic free fluid. Hepatobiliary: There is a 15 x 14 mm enhancing lesion in the dome of the liver (series 2, image 12) which is  not characterized but may represent a flash filling hemangioma or an area of portal venous shunting. Further evaluation with MRI on a nonemergent basis recommended. There is diffuse periportal edema. Mild diffuse heterogeneity of the liver parenchyma may be related to edema correlation with liver function test recommended. No calcified gallstone identified. There is diffuse thickening of the gallbladder wall with small pericholecystic fluid. Pancreas: The pancreas is unremarkable. Small amount of fluid along the posterior and inferior aspect of the pancreas likely extension from the systemic process. Correlation with pancreatic enzymes recommended to exclude pancreatitis. Spleen: Normal in size without focal abnormality. Adrenals/Urinary Tract: The right adrenal gland is unremarkable. There is a 2.8 x 2.4 cm heterogeneously enhancing nodule from the left adrenal gland which is not well characterized. Tiny areas of lower attenuation in this nodule may contain fatty tissue and therefore this nodule likely represents an adenoma or myelolipoma. Further characterization with MRI on a nonemergent basis recommended. There is heterogeneous enhancement of the left kidney with patchy areas of hypoenhancement consistent with pyelonephritis. There is a focal  area of decreased enhancement in the superior pole of the left kidney likely representing lobar nephronia. No drainable fluid collection or abscess identified at this time. There is a 2 mm nonobstructing left renal inferior pole calculus. There is moderate amount of left perinephric fluid. There is a 2 mm stone at the left ureterovesical junction with mild left hydronephrosis. There is a 3 mm stone at the right ureterovesical junction with mild right hydronephrosis. The urinary bladder is predominantly collapsed. Stomach/Bowel: There is no bowel obstruction. Thickened appearance of the colon likely related to underdistention. The appendix is normal. Vascular/Lymphatic: The  abdominal aorta and IVC are grossly unremarkable. No portal venous gas. There is no adenopathy. Reproductive: The uterus is anteverted and grossly unremarkable. There is a 3 cm right ovarian dominant follicle or corpus luteum. The left ovary contains multiple physiologic follicles are otherwise unremarkable. Other: None Musculoskeletal: No acute or significant osseous findings. IMPRESSION: 1. Bilateral UVJ calculi measuring 2 mm on the left and 3 mm on the right with mild bilateral hydronephrosis. 2. Left-sided pyelonephritis with lobar nephronia in the upper pole. No drainable fluid collection or abscess. 3. Moderate amount of left perinephric fluid as well as free fluid in the upper abdomen and pelvis may be related to pyelonephritis or secondary to rupture of a left renal calyx and urine leak. 4. Pericholecystic fluid, periportal edema, and peripancreatic fluid may be related to pyelonephritis and possible calyceal rupture/urine leak. Correlation with liver and pancreatic enzymes recommended to exclude active inflammation. 5. Small bilateral pleural effusions. 6. A 15 mm enhancing lesion in the right lobe of the liver and a left adrenal nodule as described above and incompletely characterized. Further characterization with MRI on a nonemergent basis recommended. Electronically Signed   By: Elgie Collard M.D.   On: 07/14/2018 03:26   Dg Chest Port 1 View  Result Date: 07/13/2018 CLINICAL DATA:  Patient with fever. EXAM: PORTABLE CHEST 1 VIEW COMPARISON:  None. FINDINGS: Normal cardiac and mediastinal contours. No consolidative pulmonary opacities. No pleural effusion or pneumothorax. IMPRESSION: No acute cardiopulmonary process. Electronically Signed   By: Annia Belt M.D.   On: 07/13/2018 14:12        Scheduled Meds: Continuous Infusions: . sodium chloride 125 mL/hr at 07/14/18 0811  . sodium chloride Stopped (07/14/18 0741)  . cefTRIAXone (ROCEPHIN)  IV       LOS: 1 day        Glade Lloyd, MD [facility] Pager [phone]  If 7PM-7AM, please contact night-coverage [email or web address] Password [facility] 07/14/2018, 8:34 AM

## 2018-07-14 NOTE — Brief Op Note (Signed)
07/14/2018  11:53 AM  PATIENT:  Kendra Morgan  22 y.o. female  PRE-OPERATIVE DIAGNOSIS:  bilateral ureteral stones  POST-OPERATIVE DIAGNOSIS:  * No post-op diagnosis entered *  PROCEDURE:  Procedure(s): CYSTOSCOPY WITH RETROGRADE PYELOGRAM/URETERAL STENT PLACEMENT (Bilateral)  SURGEON:  Surgeon(s) and Role:    * Sebastian Ache, MD - Primary  PHYSICIAN ASSISTANT:   ASSISTANTS: none   ANESTHESIA:   general  EBL:  minimal  BLOOD ADMINISTERED:none  DRAINS: foley to gravity   LOCAL MEDICATIONS USED:  NONE  SPECIMEN:  No Specimen  DISPOSITION OF SPECIMEN:  N/A  COUNTS:  YES  TOURNIQUET:  * No tourniquets in log *  DICTATION: .Other Dictation: Dictation Number [id number]  PLAN OF CARE: Admit to inpatient   PATIENT DISPOSITION:  PACU - hemodynamically stable.   Delay start of Pharmacological VTE agent (>24hrs) due to surgical blood loss or risk of bleeding: yes

## 2018-07-14 NOTE — Transfer of Care (Signed)
Immediate Anesthesia Transfer of Care Note  Patient: Kendra Morgan  Procedure(s) Performed: CYSTOSCOPY WITH RETROGRADE PYELOGRAM/URETERAL STENT PLACEMENT (Bilateral )  Patient Location: PACU  Anesthesia Type:General  Level of Consciousness: awake, alert , oriented and patient cooperative  Airway & Oxygen Therapy: Patient Spontanous Breathing and Patient connected to face mask oxygen  Post-op Assessment: Report given to RN, Post -op Vital signs reviewed and stable and Patient moving all extremities  Post vital signs: Reviewed and stable  Last Vitals:  Vitals Value Taken Time  BP 188/159 07/14/2018 12:12 PM  Temp    Pulse 141 07/14/2018 12:14 PM  Resp 31 07/14/2018 12:14 PM  SpO2 84 % 07/14/2018 12:14 PM  Vitals shown include unvalidated device data.  Last Pain:  Vitals:   07/14/18 1018  TempSrc: Oral  PainSc: 0-No pain      Patients Stated Pain Goal: 5 (07/13/18 2142)  Complications: No apparent anesthesia complications

## 2018-07-14 NOTE — Anesthesia Procedure Notes (Signed)
Procedure Name: Intubation Date/Time: 07/14/2018 11:28 AM Performed by: Victoriano Lain, CRNA Pre-anesthesia Checklist: Emergency Drugs available, Patient identified, Suction available, Patient being monitored and Timeout performed Patient Re-evaluated:Patient Re-evaluated prior to induction Oxygen Delivery Method: Circle system utilized Preoxygenation: Pre-oxygenation with 100% oxygen Induction Type: IV induction, Rapid sequence and Cricoid Pressure applied Laryngoscope Size: Mac and 3 Grade View: Grade I Tube type: Oral Tube size: 7.0 mm Number of attempts: 1 Airway Equipment and Method: Stylet Placement Confirmation: ETT inserted through vocal cords under direct vision,  positive ETCO2 and breath sounds checked- equal and bilateral Secured at: 21 cm Tube secured with: Tape Dental Injury: Teeth and Oropharynx as per pre-operative assessment  Comments: RSI with Cricoid pressure by Dr. Smith Robert. Grade 1 view. + ETCO2. BBS=. ATOI.

## 2018-07-14 NOTE — Anesthesia Postprocedure Evaluation (Signed)
Anesthesia Post Note  Patient: HANH PINKS  Procedure(s) Performed: CYSTOSCOPY WITH RETROGRADE PYELOGRAM/URETERAL STENT PLACEMENT (Bilateral )     Patient location during evaluation: PACU Anesthesia Type: General Level of consciousness: awake and alert Pain management: pain level controlled Vital Signs Assessment: post-procedure vital signs reviewed and stable Respiratory status: spontaneous breathing, nonlabored ventilation, respiratory function stable and patient connected to nasal cannula oxygen Cardiovascular status: blood pressure returned to baseline and stable Postop Assessment: no apparent nausea or vomiting Anesthetic complications: no    Last Vitals:  Vitals:   07/14/18 1215 07/14/18 1230  BP: (!) 102/49 106/66  Pulse: (!) 141 (!) 124  Resp: (!) 31 (!) 30  Temp:    SpO2: 94% 94%    Last Pain:  Vitals:   07/14/18 1230  TempSrc:   PainSc: Asleep                 Shelton Silvas

## 2018-07-14 NOTE — Consult Note (Signed)
Reason for Consult: Bilateral Ureteral Stones, Urosepsis, Left Adrenal Mass  Referring Physician: K . Starla Link MD    Kendra Morgan is an 22 y.o. female.   HPI:   1 - Bilateral Ureteral Stones - bilateral <94m UVJ stones with mild hydro by ER CT 07/2018 on eval flank pain. No addiitonal stones. Cr <1.  2 - Urosepsis - fevers, tachycardia, leukocytosis to 30k, and baceruria on intak labs c/w urosepsis. 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 NO h/o refractory HTN or hypokalemia.   History reviewed. No pertinent past medical history.  Past Surgical History:  Procedure Laterality Date  . WISDOM TOOTH EXTRACTION      Family History  Problem Relation Age of Onset  . Urinary tract infection Mother     Social History:  reports that she has never smoked. She has never used smokeless tobacco. She reports that she does not drink alcohol or use drugs.  Allergies: No Known Allergies  Medications: I have reviewed the patient's current medications.  Results for orders placed or performed during the hospital encounter of 07/13/18 (from the past 48 hour(s))  Lactic acid, plasma     Status: Abnormal   Collection Time: 07/13/18 11:58 AM  Result Value Ref Range   Lactic Acid, Venous 2.9 (HH) 0.5 - 1.9 mmol/L    Comment: CRITICAL RESULT CALLED TO, READ BACK BY AND VERIFIED WITH: DRUEBBISCH N AT 1255 ON [id number] FORSYTH K [facility] [address] [place] [address]  Urinalysis, Routine w reflex microscopic     Status: Abnormal   Collection Time: 07/13/18 12:09 PM  Result Value Ref Range   Color, Urine AMBER (A) YELLOW    Comment: BIOCHEMICALS MAY BE AFFECTED BY COLOR   APPearance HAZY (A) CLEAR   Specific Gravity, Urine 1.020 1.005 - 1.030   pH 6.0 5.0 - 8.0   Glucose, UA NEGATIVE NEGATIVE mg/dL   Hgb urine dipstick SMALL (A) NEGATIVE   Bilirubin Urine NEGATIVE NEGATIVE    Ketones, ur 20 (A) NEGATIVE mg/dL   Protein, ur 100 (A) NEGATIVE mg/dL   Nitrite POSITIVE (A) NEGATIVE   Leukocytes, UA SMALL (A) NEGATIVE   RBC / HPF 11-20 0 - 5 RBC/hpf   WBC, UA >50 (H) 0 - 5 WBC/hpf   Bacteria, UA MANY (A) NONE SEEN   Squamous Epithelial / LPF 0-5 0 - 5   Mucus PRESENT    Non Squamous Epithelial 0-5 (A) NONE SEEN    Comment: [facility]formed at [facility] [address], [place] [address] Pregnancy, urine POC     Status: None   Collection Time: 07/13/18 12:12 PM  Result Value Ref Range   Preg Test, Ur NEGATIVE NEGATIVE    Comment:        THE SENSITIVITY OF THIS METHODOLOGY IS >24 mIU/mL   CBC with Differential/Platelet     Status: Abnormal   Collection Time: 07/13/18 12:41 PM  Result Value Ref Range   WBC 30.3 (H) 4.0 - 10.5 K/uL    Comment: WHITE COUNT CONFIRMED ON SMEAR REPEATED TO VERIFY    RBC 3.93 3.87 - 5.11 MIL/uL   Hemoglobin 12.2 12.0 - 15.0 g/dL   HCT 35.4 (L) 36.0 - 46.0 %   MCV 90.1 78.0 - 100.0 fL   MCH 31.0 26.0 - 34.0 pg   MCHC 34.5 30.0 - 36.0 g/dL  RDW 12.3 11.5 - 15.5 %   Platelets 225 150 - 400 K/uL   Neutrophils Relative % 89 %   Neutro Abs 27.0 (H) 1.7 - 7.7 K/uL   Lymphocytes Relative 6 %   Lymphs Abs 1.7 0.7 - 4.0 K/uL   Monocytes Relative 5 %   Monocytes Absolute 1.6 (H) 0.1 - 1.0 K/uL   Eosinophils Relative 0 %   Eosinophils Absolute 0.0 0.0 - 0.7 K/uL   Basophils Relative 0 %   Basophils Absolute 0.0 0.0 - 0.1 K/uL   WBC Morphology WHITE COUNT CONFIRMED ON SMEAR     Comment: ATYPICAL LYMPHOCYTES Performed at [facility], [address], [place], [place] [address]   Comprehensive metabolic panel     Status: Abnormal   Collection Time: 07/13/18  1:37 PM  Result Value Ref Range   Sodium 137 135 - 145 mmol/L   Potassium 3.6 3.5 - 5.1 mmol/L   Chloride 108 98 - 111 mmol/L   CO2 20 (L) 22 - 32 mmol/L   Glucose, Bld 123 (H) 70 - 99 mg/dL   BUN 9 6 - 20 mg/dL   Creatinine, Ser 0.71 0.44 - 1.00 mg/dL    Calcium 7.9 (L) 8.9 - 10.3 mg/dL   Total Protein 6.1 (L) 6.5 - 8.1 g/dL   Albumin 3.6 3.5 - 5.0 g/dL   AST 15 15 - 41 U/L   ALT 11 0 - 44 U/L   Alkaline Phosphatase 55 38 - 126 U/L   Total Bilirubin 1.2 0.3 - 1.2 mg/dL   GFR calc non Af Amer >60 >60 mL/min   GFR calc Af Amer >60 >60 mL/min    Comment: (NOTE) The eGFR has been calculated using the CKD EPI equation. This calculation has not been validated in all clinical situations. eGFR's persistently <60 mL/min signify possible Chronic Kidney Disease.    Anion gap 9 5 - 15    Comment: Performed at [facility], [address] [place], [place] [address]  Procalcitonin     Status: None   Collection Time: 07/13/18  1:37 PM  Result Value Ref Range   Procalcitonin 7.82 ng/mL    Comment:        Interpretation: PCT > 2 ng/mL: Systemic infection (sepsis) is likely, unless other causes are known. (NOTE)       Sepsis PCT Algorithm           Lower Respiratory Tract                                      Infection PCT Algorithm    ----------------------------     ----------------------------         PCT < 0.25 ng/mL                PCT < 0.10 ng/mL         Strongly encourage             Strongly discourage   discontinuation of antibiotics    initiation of antibiotics    ----------------------------     -----------------------------       PCT 0.25 - 0.50 ng/mL            PCT 0.10 - 0.25 ng/mL               OR       >80% decrease in PCT  Discourage initiation of                                            antibiotics      Encourage discontinuation           of antibiotics    ----------------------------     -----------------------------         PCT >= 0.50 ng/mL              PCT 0.26 - 0.50 ng/mL               AND       <80% decrease in PCT              Encourage initiation of                                             antibiotics       Encourage continuation           of antibiotics    ----------------------------      -----------------------------        PCT >= 0.50 ng/mL                  PCT > 0.50 ng/mL               AND         increase in PCT                  Strongly encourage                                      initiation of antibiotics    Strongly encourage escalation           of antibiotics                                     -----------------------------                                           PCT <= 0.25 ng/mL                                                 OR                                        > 80% decrease in PCT                                     Discontinue / Do not initiate  antibiotics Performed at [facility], [address] [place], [place] [address]   Protime-INR     Status: Abnormal   Collection Time: 07/13/18  1:37 PM  Result Value Ref Range   Prothrombin Time 17.0 (H) 11.4 - 15.2 seconds   INR 1.40     Comment: Performed at [facility], [address] [place], [place] [address]  APTT     Status: None   Collection Time: 07/13/18  1:37 PM  Result Value Ref Range   aPTT 32 24 - 36 seconds    Comment: Performed at [facility], [place], [place] [address]  Lactic acid, plasma     Status: None   Collection Time: 07/13/18  3:05 PM  Result Value Ref Range   Lactic Acid, Venous 1.4 0.5 - 1.9 mmol/L    Comment: Performed at [facility], [address], [place], [place] [address]  MRSA PCR Screening     Status: None   Collection Time: 07/13/18  6:09 PM  Result Value Ref Range   MRSA by PCR NEGATIVE NEGATIVE    Comment:        The GeneXpert MRSA Assay (FDA approved for NASAL specimens only), is one component of a comprehensive MRSA colonization surveillance program. It is not intended to diagnose MRSA infection nor to guide or monitor treatment for MRSA infections. Performed at [facility], [place] [address], [place], [place] [address]   Basic metabolic panel      Status: Abnormal   Collection Time: 07/14/18  3:59 AM  Result Value Ref Range   Sodium 141 135 - 145 mmol/L   Potassium 3.3 (L) 3.5 - 5.1 mmol/L   Chloride 115 (H) 98 - 111 mmol/L   CO2 18 (L) 22 - 32 mmol/L   Glucose, Bld 130 (H) 70 - 99 mg/dL   BUN 8 6 - 20 mg/dL   Creatinine, Ser 0.77 0.44 - 1.00 mg/dL   Calcium 7.5 (L) 8.9 - 10.3 mg/dL   GFR calc non Af Amer >60 >60 mL/min   GFR calc Af Amer >60 >60 mL/min    Comment: (NOTE) The eGFR has been calculated using the CKD EPI equation. This calculation has not been validated in all clinical situations. eGFR's persistently <60 mL/min signify possible Chronic Kidney Disease.    Anion gap 8 5 - 15    Comment: Performed at [facility], [place] [address], [place], [place] [address]  CBC     Status: Abnormal   Collection Time: 07/14/18  3:59 AM  Result Value Ref Range   WBC 27.9 (H) 4.0 - 10.5 K/uL   RBC 3.81 (L) 3.87 - 5.11 MIL/uL   Hemoglobin 11.7 (L) 12.0 - 15.0 g/dL   HCT 34.7 (L) 36.0 - 46.0 %   MCV 91.1 78.0 - 100.0 fL   MCH 30.7 26.0 - 34.0 pg   MCHC 33.7 30.0 - 36.0 g/dL   RDW 12.8 11.5 - 15.5 %   Platelets 199 150 - 400 K/uL    Comment: Performed at [facility], [place] [address] [place], [place] [address]    Ct Abdomen Pelvis W Contrast  Result Date: 07/14/2018 CLINICAL DATA:  22 year old female with pyelonephritis, abdominal pain, nausea vomiting. EXAM: CT ABDOMEN AND PELVIS WITH CONTRAST TECHNIQUE: Multidetector CT imaging of the abdomen and pelvis was performed using the standard protocol following bolus administration of intravenous contrast. CONTRAST:  119m OMNIPAQUE IOHEXOL 300 MG/ML  SOLN COMPARISON:  None. FINDINGS:  Lower chest: There are partially visualized small bilateral pleural effusions and associated partial subsegmental atelectasis of the lung bases. There is no intra-abdominal free air. There is moderate free fluid within the pelvis as well as small upper abdominal and  perihepatic free fluid. Hepatobiliary: There is a 15 x 14 mm enhancing lesion in the dome of the liver (series 2, image 12) which is not characterized but may represent a flash filling hemangioma or an area of portal venous shunting. Further evaluation with MRI on a nonemergent basis recommended. There is diffuse periportal edema. Mild diffuse heterogeneity of the liver parenchyma may be related to edema correlation with liver function test recommended. No calcified gallstone identified. There is diffuse thickening of the gallbladder wall with small pericholecystic fluid. Pancreas: The pancreas is unremarkable. Small amount of fluid along the posterior and inferior aspect of the pancreas likely extension from the systemic process. Correlation with pancreatic enzymes recommended to exclude pancreatitis. Spleen: Normal in size without focal abnormality. Adrenals/Urinary Tract: The right adrenal gland is unremarkable. There is a 2.8 x 2.4 cm heterogeneously enhancing nodule from the left adrenal gland which is not well characterized. Tiny areas of lower attenuation in this nodule may contain fatty tissue and therefore this nodule likely represents an adenoma or myelolipoma. Further characterization with MRI on a nonemergent basis recommended. There is heterogeneous enhancement of the left kidney with patchy areas of hypoenhancement consistent with pyelonephritis. There is a focal area of decreased enhancement in the superior pole of the left kidney likely representing lobar nephronia. No drainable fluid collection or abscess identified at this time. There is a 2 mm nonobstructing left renal inferior pole calculus. There is moderate amount of left perinephric fluid. There is a 2 mm stone at the left ureterovesical junction with mild left hydronephrosis. There is a 3 mm stone at the right ureterovesical junction with mild right hydronephrosis. The urinary bladder is predominantly collapsed. Stomach/Bowel: There is no  bowel obstruction. Thickened appearance of the colon likely related to underdistention. The appendix is normal. Vascular/Lymphatic: The abdominal aorta and IVC are grossly unremarkable. No portal venous gas. There is no adenopathy. Reproductive: The uterus is anteverted and grossly unremarkable. There is a 3 cm right ovarian dominant follicle or corpus luteum. The left ovary contains multiple physiologic follicles are otherwise unremarkable. Other: None Musculoskeletal: No acute or significant osseous findings. IMPRESSION: 1. Bilateral UVJ calculi measuring 2 mm on the left and 3 mm on the right with mild bilateral hydronephrosis. 2. Left-sided pyelonephritis with lobar nephronia in the upper pole. No drainable fluid collection or abscess. 3. Moderate amount of left perinephric fluid as well as free fluid in the upper abdomen and pelvis may be related to pyelonephritis or secondary to rupture of a left renal calyx and urine leak. 4. Pericholecystic fluid, periportal edema, and peripancreatic fluid may be related to pyelonephritis and possible calyceal rupture/urine leak. Correlation with liver and pancreatic enzymes recommended to exclude active inflammation. 5. Small bilateral pleural effusions. 6. A 15 mm enhancing lesion in the right lobe of the liver and a left adrenal nodule as described above and incompletely characterized. Further characterization with MRI on a nonemergent basis recommended. Electronically Signed   By: Anner Crete M.D.   On: 07/14/2018 03:26   Dg Chest Port 1 View  Result Date: 07/13/2018 CLINICAL DATA:  Patient with fever. EXAM: PORTABLE CHEST 1 VIEW COMPARISON:  None. FINDINGS: Normal cardiac and mediastinal contours. No consolidative pulmonary opacities. No pleural effusion or pneumothorax. IMPRESSION: No  acute cardiopulmonary process. Electronically Signed   By: Lovey Newcomer M.D.   On: 07/13/2018 14:12    Review of Systems  Constitutional: Positive for chills, fever and  malaise/fatigue.  HENT: Negative.   Respiratory: Negative.   Cardiovascular: Negative.   Gastrointestinal: Positive for nausea.  Genitourinary: Positive for flank pain.  Skin: Negative.   Neurological: Negative.   Endo/Heme/Allergies: Negative.   Psychiatric/Behavioral: Negative.    Blood pressure 112/82, pulse (!) 138, temperature 99.4 F (37.4 C), temperature source Oral, resp. rate (!) 40, height _0  (1.499 m), weight 55 kg, last menstrual period 06/29/2018, SpO2 100 %. Physical Exam  Constitutional: She appears well-developed.  Mother at bedside  HENT:  Head: Normocephalic.  Eyes: Pupils are equal, round, and reactive to light.  Neck: Normal range of motion.  Cardiovascular: Normal rate.  Respiratory: Effort normal.  GI: Soft.  Genitourinary:  Genitourinary Comments: Mild CVAT  Neurological: She is alert.  Skin: Skin is warm.  Psychiatric: She has a normal mood and affect.    Assessment/Plan:  1 - Bilateral Ureteral Stones - rec urgent bilateral stenting for renal decompression to prevent progression to acute renal failure and he help control infection. Risks, benefits, expected peri-op course.   2 - Urosepsis - Agree with current ABX pending further CX data. Renal decompression as per above.   3 -Left Adrenal Mass -  Will need functional studies and dedicated imaging in elective setting.   Kendra Morgan 07/14/2018, 7:57 AM

## 2018-07-14 NOTE — Op Note (Signed)
NAME: Kendra Morgan, Kendra Morgan MEDICAL RECORD DG:[id number] ACCOUNT [id number] DATE OF BIRTH:1996-10-28 FACILITY: [facility] LOCATION: [facility] PHYSICIAN:Anvi Mangal, MD  OPERATIVE REPORT  DATE OF PROCEDURE:  07/14/2018  PREOPERATIVE DIAGNOSIS:  Bilateral ureteral stones, urosepsis.  PROCEDURES:   1.  Cystoscopy with bilateral pyelograms, interpretation. 2.  Insertion of bilateral ureteral stents 5 x 22 Polaris, no tether.  ESTIMATED BLOOD LOSS:  Nil.  COMPLICATIONS:  None.  SPECIMENS:  None.  DRAINS:  Foley catheter to straight drain.  FINDINGS: 1.  Bilateral distal ureteral stones with left greater than right mild hydronephrosis. 2.  Distal placement of bilateral ureteral stents proximal renal pelvis, distally in bladder.  INDICATIONS:  The patient is a 22 year old young woman with a history of prior nephrolithiasis who on workup for colicky flank pain and fevers to have bilateral ureteral stones, tachycardia, leukocytosis consistent with urosepsis.  She was admitted to  the ICU service last night and has been on IV antibiotics broad spectrum.  Urgent urologic consultation was sought this morning.  She was evaluated and immediately felt that urgent renal decompression was warranted.  Options were discussed including  stents versus nephrostomy tubes and she wished to proceed with bilateral stents.  Informed consent was obtained and placed in medical record.  The patient tolerated the procedure.  DESCRIPTION OF PROCEDURE:  The patient was identified.  Procedure being bilateral ureteral stenting was confirmed.  Procedure timeout was performed.  Intravenous access administered.  General anesthesia induced.  The patient was placed into a low  lithotomy position and sterile field was created and prepped and draped the patient's vagina, introitus and proximal thighs using iodine.  Cystourethroscopy was performed using a 22-French rigid cystoscope with offset lens.  Inspection of the bladder   revealed no diverticula, calcifications or papillary lesions.  The left ureteral orifice was cannulated with a 6-French open end catheter and left retrograde pyelogram was obtained.  Left retrograde pyelogram demonstrated a single left ureter with single system left kidney.  There was a filling defect in the distal ureter consistent with known stone.  There was mild hydroureteronephrosis above this.  A 0.038 ZIPwire was in the lower  pole of which a new 5 x 22 Polaris-type stent was placed using cystoscopic and fluoroscopic guidance.  Good proximal and distal points were noted.  Similarly, right retrograde pyelogram was obtained.  Right retrograde pyelogram demonstrated a single right ureter, single system right kidney, mild hydroureteronephrosis noted to the distal ureter.  Similarly, the ZIPwire was once again advanced to the upper pole and a separate new 5 x 22 Polaris stent  was placed on the right side using cystoscopic and fluoroscopic guidance.  The cystoscope was exchanged for a Foley catheter per urethra.  Straight drain 10 mL to the balloon.  The procedure terminated.  The patient tolerated the procedure well.  No  immediate complications.  The patient was taken to the post-anesthesia care in stable condition.  TN/NUANCE  D:07/14/2018 T:07/14/2018 JOB:[id number]

## 2018-07-15 ENCOUNTER — Encounter (HOSPITAL_COMMUNITY): Payer: Self-pay | Admitting: Urology

## 2018-07-15 DIAGNOSIS — T7840XA Allergy, unspecified, initial encounter: Secondary | ICD-10-CM

## 2018-07-15 DIAGNOSIS — E279 Disorder of adrenal gland, unspecified: Secondary | ICD-10-CM

## 2018-07-15 LAB — COMPREHENSIVE METABOLIC PANEL
ALT: 16 U/L (ref 0–44)
AST: 18 U/L (ref 15–41)
Albumin: 2.4 g/dL — ABNORMAL LOW (ref 3.5–5.0)
Alkaline Phosphatase: 68 U/L (ref 38–126)
Anion gap: 8 (ref 5–15)
BUN: 11 mg/dL (ref 6–20)
CHLORIDE: 115 mmol/L — AB (ref 98–111)
CO2: 21 mmol/L — ABNORMAL LOW (ref 22–32)
Calcium: 8.3 mg/dL — ABNORMAL LOW (ref 8.9–10.3)
Creatinine, Ser: 0.75 mg/dL (ref 0.44–1.00)
GFR calc Af Amer: >60 mL/min (ref >60)
GLUCOSE: 157 mg/dL — AB (ref 70–99)
POTASSIUM: 3.8 mmol/L (ref 3.5–5.1)
Sodium: 144 mmol/L (ref 135–145)
Total Bilirubin: 0.5 mg/dL (ref 0.3–1.2)
Total Protein: 4.9 g/dL — ABNORMAL LOW (ref 6.5–8.1)

## 2018-07-15 LAB — CBC WITH DIFFERENTIAL/PLATELET
BASOS ABS: 0 10*3/uL (ref 0.0–0.1)
BASOS PCT: 0 %
EOS PCT: 0 %
Eosinophils Absolute: 0 10*3/uL (ref 0.0–0.7)
HEMATOCRIT: 33.2 % — AB (ref 36.0–46.0)
Hemoglobin: 11.3 g/dL — ABNORMAL LOW (ref 12.0–15.0)
LYMPHS PCT: 4 %
Lymphs Abs: 1.2 10*3/uL (ref 0.7–4.0)
MCH: 30.5 pg (ref 26.0–34.0)
MCHC: 34 g/dL (ref 30.0–36.0)
MCV: 89.5 fL (ref 78.0–100.0)
MONO ABS: 1.2 10*3/uL — AB (ref 0.1–1.0)
Monocytes Relative: 4 %
Neutro Abs: 26.2 10*3/uL — ABNORMAL HIGH (ref 1.7–7.7)
Neutrophils Relative %: 92 %
PLATELETS: 213 10*3/uL (ref 150–400)
RBC: 3.71 MIL/uL — ABNORMAL LOW (ref 3.87–5.11)
RDW: 13 % (ref 11.5–15.5)
WBC: 28.6 10*3/uL — AB (ref 4.0–10.5)

## 2018-07-15 LAB — URINE CULTURE: Culture: >=100000 — AB

## 2018-07-15 LAB — MAGNESIUM: MAGNESIUM: 1.7 mg/dL (ref 1.7–2.4)

## 2018-07-15 MED ORDER — INFLUENZA VAC SPLIT QUAD 0.5 ML IM SUSY
0.5000 mL | PREFILLED_SYRINGE | INTRAMUSCULAR | Status: AC
  Administered 2018-07-16: 0.5 mL via INTRAMUSCULAR
  Filled 2018-07-15: qty 0.5

## 2018-07-15 MED ORDER — DIPHENHYDRAMINE HCL 50 MG/ML IJ SOLN
50.0000 mg | Freq: Four times a day (QID) | INTRAMUSCULAR | Status: DC | PRN
  Administered 2018-07-15: 50 mg via INTRAVENOUS
  Filled 2018-07-15: qty 1

## 2018-07-15 MED ORDER — DIPHENHYDRAMINE HCL 50 MG/ML IJ SOLN
50.0000 mg | Freq: Once | INTRAMUSCULAR | Status: AC
  Administered 2018-07-15: 50 mg via INTRAVENOUS
  Filled 2018-07-15: qty 1

## 2018-07-15 MED ORDER — FAMOTIDINE IN NACL 20-0.9 MG/50ML-% IV SOLN
20.0000 mg | Freq: Two times a day (BID) | INTRAVENOUS | Status: DC
  Administered 2018-07-15: 20 mg via INTRAVENOUS
  Filled 2018-07-15: qty 50

## 2018-07-15 MED ORDER — SODIUM CHLORIDE 0.9 % IV SOLN
1.0000 g | Freq: Four times a day (QID) | INTRAVENOUS | Status: DC
  Administered 2018-07-15 – 2018-07-16 (×4): 1 g via INTRAVENOUS
  Filled 2018-07-15 (×2): qty 1
  Filled 2018-07-15: qty 1000
  Filled 2018-07-15 (×2): qty 1

## 2018-07-15 MED ORDER — FAMOTIDINE IN NACL 20-0.9 MG/50ML-% IV SOLN
20.0000 mg | Freq: Two times a day (BID) | INTRAVENOUS | Status: DC | PRN
  Administered 2018-07-15: 20 mg via INTRAVENOUS
  Filled 2018-07-15: qty 50

## 2018-07-15 NOTE — Progress Notes (Addendum)
This RN called to pt's room and pt stated that face was swelling and noted a red rash to right side of face and starting to go down her neck to her upper chest. Pt stated she did not wash her face with anything, no new medication was given before hand, pt also stated she felt no swelling in her throat just that right side of face swollen and no itching.  MD was paged and arrived at bedside to examine. Medication IV ordered, pt placed back on all telemetry to be monitored since was previously going to be transferred as med-surg. Will continue to monitor

## 2018-07-15 NOTE — Addendum Note (Signed)
Addendum  created 07/15/18 0827 by Elyn Peers, CRNA   Charge Capture section accepted

## 2018-07-15 NOTE — Progress Notes (Signed)
1 Day Post-Op   Subjective/Chief Complaint:   1 - Bilateral Ureteral Stones - bilateral <54mm UVJ stones with mild hydro by ER CT 07/2018 on eval flank pain. No addiitonal stones. Cr <1. Bilateral 5x22 stents place 9/3 for urgent renal decompression.   2 - Urosepsis - fevers, tachycardia, leukocytosis to 30k, and baceruria on intak labs c/w urosepsis. Placed on rocephiin, BCX, UCX 9/2 pan-sensitive e. Coli.   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 NO h/o refractory HTN or hypokalemia.   Today "Mckayla" is stable. Fevers improving and UCX with pan-sensitive e.coli.   Objective: Vital signs in last 24 hours: Temp:  [97.5 F (36.4 C)-100 F (37.8 C)] 98 F (36.7 C) (09/04 0347) Pulse Rate:  [66-143] 66 (09/04 0800) Resp:  [6-33] 19 (09/04 0800) BP: (94-110)/(40-66) 97/49 (09/04 0800) SpO2:  [93 %-100 %] 93 % (09/04 0800) Weight:  [55 kg] 55 kg (09/03 1018) Last BM Date: 07/13/18  Intake/Output from previous day: 09/03 0701 - 09/04 0700 In: 4060.4 [P.O.:240; I.V.:3720.4; IV Piggyback:100] Out: 1340 [Urine:1340] Intake/Output this shift: No intake/output data recorded.    Physical Exam  Constitutional: She appears well-developed.  Mother at bedside  HENT:  Head: Normocephalic.  Eyes: Pupils are equal, round, and reactive to light.  Neck: Normal range of motion.  Cardiovascular: Normal rate.  Respiratory: Effort normal.  GI: Soft.  Genitourinary:  Genitourinary Comments: Mild CVAT  Neurological: She is alert.  Skin: Skin is warm.  Psychiatric: She has a normal mood and affect.    Lab Results:  Recent Labs    07/14/18 0359 07/15/18 0341  WBC 27.9* 28.6*  HGB 11.7* 11.3*  HCT 34.7* 33.2*  PLT 199 213   BMET Recent Labs    07/14/18 0359 07/15/18 0341  NA 141 144  K 3.3* 3.8  CL 115* 115*  CO2 18* 21*  GLUCOSE 130* 157*  BUN 8 11  CREATININE 0.77 0.75  CALCIUM 7.5* 8.3*   PT/INR Recent Labs    07/13/18 1337   LABPROT 17.0*  INR 1.40   ABG No results for input(s): PHART, HCO3 in the last 72 hours.  Invalid input(s): PCO2, PO2  Studies/Results: Ct Abdomen Pelvis W Contrast  Result Date: 07/14/2018 CLINICAL DATA:  22 year old female with pyelonephritis, abdominal pain, nausea vomiting. EXAM: CT ABDOMEN AND PELVIS WITH CONTRAST TECHNIQUE: Multidetector CT imaging of the abdomen and pelvis was performed using the standard protocol following bolus administration of intravenous contrast. CONTRAST:  OMNIPAQUE IOHEXOL 300 MG/ML  SOLN COMPARISON:  None. FINDINGS: Lower chest: There are partially visualized small bilateral pleural effusions and associated partial subsegmental atelectasis of the lung bases. There is no intra-abdominal free air. There is moderate free fluid within the pelvis as well as small upper abdominal and perihepatic free fluid. Hepatobiliary: There is a 15 x 14 mm enhancing lesion in the dome of the liver (series 2, image 12) which is not characterized but may represent a flash filling hemangioma or an area of portal venous shunting. Further evaluation with MRI on a nonemergent basis recommended. There is diffuse periportal edema. Mild diffuse heterogeneity of the liver parenchyma may be related to edema correlation with liver function test recommended. No calcified gallstone identified. There is diffuse thickening of the gallbladder wall with small pericholecystic fluid. Pancreas: The pancreas is unremarkable. Small amount of fluid along the posterior and inferior aspect of the pancreas likely extension from the systemic process. Correlation with pancreatic enzymes recommended to  exclude pancreatitis. Spleen: Normal in size without focal abnormality. Adrenals/Urinary Tract: The right adrenal gland is unremarkable. There is a 2.8 x 2.4 cm heterogeneously enhancing nodule from the left adrenal gland which is not well characterized. Tiny areas of lower attenuation in this nodule may contain  fatty tissue and therefore this nodule likely represents an adenoma or myelolipoma. Further characterization with MRI on a nonemergent basis recommended. There is heterogeneous enhancement of the left kidney with patchy areas of hypoenhancement consistent with pyelonephritis. There is a focal area of decreased enhancement in the superior pole of the left kidney likely representing lobar nephronia. No drainable fluid collection or abscess identified at this time. There is a 2 mm nonobstructing left renal inferior pole calculus. There is moderate amount of left perinephric fluid. There is a 2 mm stone at the left ureterovesical junction with mild left hydronephrosis. There is a 3 mm stone at the right ureterovesical junction with mild right hydronephrosis. The urinary bladder is predominantly collapsed. Stomach/Bowel: There is no bowel obstruction. Thickened appearance of the colon likely related to underdistention. The appendix is normal. Vascular/Lymphatic: The abdominal aorta and IVC are grossly unremarkable. No portal venous gas. There is no adenopathy. Reproductive: The uterus is anteverted and grossly unremarkable. There is a 3 cm right ovarian dominant follicle or corpus luteum. The left ovary contains multiple physiologic follicles are otherwise unremarkable. Other: None Musculoskeletal: No acute or significant osseous findings. IMPRESSION: 1. Bilateral UVJ calculi measuring 2 mm on the left and 3 mm on the right with mild bilateral hydronephrosis. 2. Left-sided pyelonephritis with lobar nephronia in the upper pole. No drainable fluid collection or abscess. 3. Moderate amount of left perinephric fluid as well as free fluid in the upper abdomen and pelvis may be related to pyelonephritis or secondary to rupture of a left renal calyx and urine leak. 4. Pericholecystic fluid, periportal edema, and peripancreatic fluid may be related to pyelonephritis and possible calyceal rupture/urine leak. Correlation with  liver and pancreatic enzymes recommended to exclude active inflammation. 5. Small bilateral pleural effusions. 6. A 15 mm enhancing lesion in the right lobe of the liver and a left adrenal nodule as described above and incompletely characterized. Further characterization with MRI on a nonemergent basis recommended. Electronically Signed   By: Elgie Collard M.D.   On: 07/14/2018 03:26   Dg Chest Port 1 View  Result Date: 07/13/2018 CLINICAL DATA:  Patient with fever. EXAM: PORTABLE CHEST 1 VIEW COMPARISON:  None. FINDINGS: Normal cardiac and mediastinal contours. No consolidative pulmonary opacities. No pleural effusion or pneumothorax. IMPRESSION: No acute cardiopulmonary process. Electronically Signed   By: Annia Belt M.D.   On: 07/13/2018 14:12   Dg C-arm 1-60 Min-no Report  Result Date: 07/14/2018 Fluoroscopy was utilized by the requesting physician.  No radiographic interpretation.    Anti-infectives: Anti-infectives (From admission, onward)   Start     Dose/Rate Route Frequency Ordered Stop   07/15/18 1200  ampicillin (OMNIPEN) 1 g in sodium chloride 0.9 % 100 mL IVPB     1 g 300 mL/hr over 20 Minutes Intravenous Every 6 hours 07/15/18 0822     07/14/18 1400  cefTRIAXone (ROCEPHIN) 2 g in sodium chloride 0.9 % 100 mL IVPB  Status:  Discontinued     2 g 200 mL/hr over 30 Minutes Intravenous Every 24 hours 07/13/18 2046 07/15/18 0823   07/13/18 2045  cefTRIAXone (ROCEPHIN) 1 g in sodium chloride 0.9 % 100 mL IVPB     1 g 200  mL/hr over 30 Minutes Intravenous  Once 07/13/18 2037 07/13/18 2209   07/13/18 1315  cefTRIAXone (ROCEPHIN) 1 g in sodium chloride 0.9 % 100 mL IVPB  Status:  Discontinued     1 g 200 mL/hr over 30 Minutes Intravenous Every 24 hours 07/13/18 1305 07/13/18 2046      Assessment/Plan:  1 - Bilateral Ureteral Stones - temporize with stenting. We will arranage elective ureteroscopy in few weeks for definitive treatment.   2 - Urosepsis - Improving quickly. Cal  likely Dc on PO Cipro as soon as tomorrow based on clinical improvement and CX data.    3 -Left Adrenal Mass -  Will need functional studies and dedicated imaging in elective setting.   Appreciate hospitalist service comanageemnt.   Please call me directly anytime with questions.    [facility], Analeah Brame 07/15/2018

## 2018-07-15 NOTE — Progress Notes (Signed)
PROGRESS NOTE    Kendra Morgan   UJW:[id number]  DOB: August 22, 1996  DOA: 07/13/2018 PCP: Patient, No Pcp Per   Brief Narrative:  Kendra Morgan 22 year old female with no past medical history presented with abdominal pain and left flank pain for 2 weeks for which she was treating herself with over-the-counter urinary medications.  She was found to have sepsis secondary to pyelonephritis and started on intravenous antibiotics.   Subjective: Left flank pain is controlled with medications. Mild nausea is present.  I did go back to re-evalute her as I was called for acute onset swelling and redness of right face, neck and upper chest wall. She did not claim to have any itching on her skin, any trouble with respirations or swallowing. No swelling or erythema on other areas of the body.   Assessment & Plan:   Active Problems:   Sepsis secondary to Pyelonephritis, bilateral nephrolithiasis with mild Hydronephrosis - s/p bilateral ureteral stents by Dr Berneice Heinrich on 9/3 -cont treatment with IV antibiotics a this point- I have switched her from Ceftriaxone to Ampicillin based on the fact that the culture is growing e coli sensitive to Amp - foley to be removed today  - blood cultures negative - WBC count still significantly elevated at 28.6   A 15 mm enhancing lesion in the right lobe of the liver and left adrenal nodule  - noted incidentally on CT- MRI recommended  Allergic reaction? - right facial swelling started after I evaluated her this AM and extended down the right neck- there was erythema which as spread from the face to the neck and upper chest- no swelling noted in neck or chest - edema improved after starting Benadryl and Pepcid IV this AM- she is mildly erythematous on upper chest wal - no associated itching, swallowing or breathing difficulty noted - I am uncertain as to the cause of the reaction- she did not receive any new medications prior to the symptoms starting (last meds were  last night)- cont to follow in SDU  DVT prophylaxis: SCDs Code Status: Full code Family Communication: mother at bedside Disposition Plan: follow in SDU for recurrence of allergic reaction Consultants:   urology Procedures:   1.  Cystoscopy with bilateral pyelograms, interpretation.  2.  Insertion of bilateral ureteral stents 5 x 22 Polaris, no tether. Antimicrobials:  Anti-infectives (From admission, onward)   Start     Dose/Rate Route Frequency Ordered Stop   07/15/18 1200  ampicillin (OMNIPEN) 1 g in sodium chloride 0.9 % 100 mL IVPB     1 g 300 mL/hr over 20 Minutes Intravenous Every 6 hours 07/15/18 0822     07/14/18 1400  cefTRIAXone (ROCEPHIN) 2 g in sodium chloride 0.9 % 100 mL IVPB  Status:  Discontinued     2 g 200 mL/hr over 30 Minutes Intravenous Every 24 hours 07/13/18 2046 07/15/18 0823   07/13/18 2045  cefTRIAXone (ROCEPHIN) 1 g in sodium chloride 0.9 % 100 mL IVPB     1 g 200 mL/hr over 30 Minutes Intravenous  Once 07/13/18 2037 07/13/18 2209   07/13/18 1315  cefTRIAXone (ROCEPHIN) 1 g in sodium chloride 0.9 % 100 mL IVPB  Status:  Discontinued     1 g 200 mL/hr over 30 Minutes Intravenous Every 24 hours 07/13/18 1305 07/13/18 2046       Objective: Vitals:   07/15/18 0400 07/15/18 0600 07/15/18 0700 07/15/18 0800  BP: 102/64 (!) 102/55 (!) 98/54 (!) 97/49  Pulse: 87 76  72 66  Resp: (!) 24 18 19 19   Temp:    98.4 F (36.9 C)  TempSrc:    Oral  SpO2: 98% 94% 94% 93%  Weight:      Height:        Intake/Output Summary (Last 24 hours) at 07/15/2018 1400 Last data filed at 07/15/2018 1230 Gross per 24 hour  Intake 3608.84 ml  Output 1390 ml  Net 2218.84 ml   Filed Weights   07/13/18 1152 07/13/18 1812 07/14/18 1018  Weight: 54 kg 55 kg 55 kg    Examination: General exam: Appears comfortable  HEENT: PERRLA, oral mucosa moist, no sclera icterus or thrush Respiratory system: Clear to auscultation. Respiratory effort normal. Cardiovascular system: S1 &  S2 heard, RRR.   Gastrointestinal system: Abdomen soft, non-tender, nondistended. Normal bowel sound. No organomegaly Central nervous system: Alert and oriented. No focal neurological deficits. Extremities: No cyanosis, clubbing or edema Skin: erythema and swelling on right maxilarry area with mild involvement the mandible, confluent erythema on right neck and upper chest also noted without swelling Psychiatry:  Mood & affect appropriate.     Data Reviewed: I have personally reviewed following labs and imaging studies  CBC: Recent Labs  Lab 07/13/18 1241 07/14/18 0359 07/15/18 0341  WBC 30.3* 27.9* 28.6*  NEUTROABS 27.0*  --  26.2*  HGB 12.2 11.7* 11.3*  HCT 35.4* 34.7* 33.2*  MCV 90.1 91.1 89.5  PLT 225 199 213   Basic Metabolic Panel: Recent Labs  Lab 07/13/18 1337 07/14/18 0359 07/15/18 0341  NA 137 141 144  K 3.6 3.3* 3.8  CL 108 115* 115*  CO2 20* 18* 21*  GLUCOSE 123* 130* 157*  BUN 9 8 11   CREATININE 0.71 0.77 0.75  CALCIUM 7.9* 7.5* 8.3*  MG  --   --  1.7   GFR: Estimated Creatinine Clearance: 84.8 mL/min (by C-G formula based on SCr of 0.75 mg/dL). Liver Function Tests: Recent Labs  Lab 07/13/18 1337 07/15/18 0341  AST 15 18  ALT 11 16  ALKPHOS 55 68  BILITOT 1.2 0.5  PROT 6.1* 4.9*  ALBUMIN 3.6 2.4*   No results for input(s): LIPASE, AMYLASE in the last 168 hours. No results for input(s): AMMONIA in the last 168 hours. Coagulation Profile: Recent Labs  Lab 07/13/18 1337  INR 1.40   Cardiac Enzymes: No results for input(s): CKTOTAL, CKMB, CKMBINDEX, TROPONINI in the last 168 hours. BNP (last 3 results) No results for input(s): PROBNP in the last 8760 hours. HbA1C: No results for input(s): HGBA1C in the last 72 hours. CBG: No results for input(s): GLUCAP in the last 168 hours. Lipid Profile: No results for input(s): CHOL, HDL, LDLCALC, TRIG, CHOLHDL, LDLDIRECT in the last 72 hours. Thyroid Function Tests: No results for input(s): TSH,  T4TOTAL, FREET4, T3FREE, THYROIDAB in the last 72 hours. Anemia Panel: No results for input(s): VITAMINB12, FOLATE, FERRITIN, TIBC, IRON, RETICCTPCT in the last 72 hours. Urine analysis:    Component Value Date/Time   COLORURINE AMBER (A) 07/13/2018 1209   APPEARANCEUR HAZY (A) 07/13/2018 1209   LABSPEC 1.020 07/13/2018 1209   PHURINE 6.0 07/13/2018 1209   GLUCOSEU NEGATIVE 07/13/2018 1209   HGBUR SMALL (A) 07/13/2018 1209   BILIRUBINUR NEGATIVE 07/13/2018 1209   KETONESUR 20 (A) 07/13/2018 1209   PROTEINUR 100 (A) 07/13/2018 1209   UROBILINOGEN 0.2 04/12/2011 1441   NITRITE POSITIVE (A) 07/13/2018 1209   LEUKOCYTESUR SMALL (A) 07/13/2018 1209   Sepsis Labs: @LABRCNTIP (procalcitonin:4,lacticidven:4) ) Recent Results (from  the past 240 hour(s))  Urine Culture     Status: Abnormal   Collection Time: 07/13/18 12:09 PM  Result Value Ref Range Status   Specimen Description   Final    URINE, CLEAN CATCH Performed at [facility], [address], [place], [place] [address]    Special Requests   Final    NONE Performed at [facility], [address] [place], [place] [address]    Culture >=100,000 COLONIES/mL ESCHERICHIA COLI (A)  Final   Report Status 07/15/2018 FINAL  Final   Organism ID, Bacteria ESCHERICHIA COLI (A)  Final      Susceptibility   Escherichia coli - MIC*    AMPICILLIN <=2 SENSITIVE Sensitive     CEFAZOLIN <=4 SENSITIVE Sensitive     CEFTRIAXONE <=1 SENSITIVE Sensitive     CIPROFLOXACIN <=0.25 SENSITIVE Sensitive     GENTAMICIN <=1 SENSITIVE Sensitive     IMIPENEM <=0.25 SENSITIVE Sensitive     NITROFURANTOIN <=16 SENSITIVE Sensitive     TRIMETH/SULFA <=20 SENSITIVE Sensitive     AMPICILLIN/SULBACTAM <=2 SENSITIVE Sensitive     PIP/TAZO <=4 SENSITIVE Sensitive     Extended ESBL NEGATIVE Sensitive     * >=100,000 COLONIES/mL ESCHERICHIA COLI  Culture, blood (x 2)     Status: None (Preliminary result)   Collection Time: 07/13/18  1:28 PM  Result  Value Ref Range Status   Specimen Description   Final    BLOOD LEFT ARM Performed at [facility], [address], [place], [place] [address]    Special Requests   Final    BOTTLES DRAWN AEROBIC AND ANAEROBIC Blood Culture adequate volume Performed at [facility], [address], [place], [place] [address]    Culture   Final    NO GROWTH 2 DAYS Performed at [facility], [address] [place], [place] [address]    Report Status PENDING  Incomplete  Culture, blood (x 2)     Status: None (Preliminary result)   Collection Time: 07/13/18  1:37 PM  Result Value Ref Range Status   Specimen Description   Final    BLOOD LEFT ARM Performed at [facility], [address] [place], [place] [address]    Special Requests   Final    BOTTLES DRAWN AEROBIC ONLY Blood Culture adequate volume Performed at [facility] [address], [place], [place] [address]    Culture   Final    NO GROWTH 2 DAYS Performed at [facility], [address], [place], [place] [address]    Report Status PENDING  Incomplete  MRSA PCR Screening     Status: None   Collection Time: 07/13/18  6:09 PM  Result Value Ref Range Status   MRSA by PCR NEGATIVE NEGATIVE Final    Comment:        The GeneXpert MRSA Assay (FDA approved for NASAL specimens only), is one component of a comprehensive MRSA colonization surveillance program. It is not intended to diagnose MRSA infection nor to guide or monitor treatment for MRSA infections. Performed at [facility], [address], [place], [place] [address]          Radiology Studies: Ct Abdomen Pelvis W Contrast  Result Date: 07/14/2018 CLINICAL DATA:  22 year old female with pyelonephritis, abdominal pain, nausea vomiting. EXAM: CT ABDOMEN AND PELVIS WITH CONTRAST TECHNIQUE: Multidetector CT imaging of the abdomen and pelvis was performed using the standard protocol following bolus administration of intravenous  contrast. CONTRAST:   OMNIPAQUE IOHEXOL 300 MG/ML  SOLN COMPARISON:  None. FINDINGS: Lower chest: There are partially visualized small bilateral pleural effusions and associated partial subsegmental atelectasis of the lung bases. There is no intra-abdominal free air. There is moderate free fluid within the pelvis as well as small upper abdominal and perihepatic free fluid. Hepatobiliary: There is a 15 x 14 mm enhancing lesion in the dome of the liver (series 2, image 12) which is not characterized but may represent a flash filling hemangioma or an area of portal venous shunting. Further evaluation with MRI on a nonemergent basis recommended. There is diffuse periportal edema. Mild diffuse heterogeneity of the liver parenchyma may be related to edema correlation with liver function test recommended. No calcified gallstone identified. There is diffuse thickening of the gallbladder wall with small pericholecystic fluid. Pancreas: The pancreas is unremarkable. Small amount of fluid along the posterior and inferior aspect of the pancreas likely extension from the systemic process. Correlation with pancreatic enzymes recommended to exclude pancreatitis. Spleen: Normal in size without focal abnormality. Adrenals/Urinary Tract: The right adrenal gland is unremarkable. There is a 2.8 x 2.4 cm heterogeneously enhancing nodule from the left adrenal gland which is not well characterized. Tiny areas of lower attenuation in this nodule may contain fatty tissue and therefore this nodule likely represents an adenoma or myelolipoma. Further characterization with MRI on a nonemergent basis recommended. There is heterogeneous enhancement of the left kidney with patchy areas of hypoenhancement consistent with pyelonephritis. There is a focal area of decreased enhancement in the superior pole of the left kidney likely representing lobar nephronia. No drainable fluid collection or abscess identified at this time. There is a 2 mm  nonobstructing left renal inferior pole calculus. There is moderate amount of left perinephric fluid. There is a 2 mm stone at the left ureterovesical junction with mild left hydronephrosis. There is a 3 mm stone at the right ureterovesical junction with mild right hydronephrosis. The urinary bladder is predominantly collapsed. Stomach/Bowel: There is no bowel obstruction. Thickened appearance of the colon likely related to underdistention. The appendix is normal. Vascular/Lymphatic: The abdominal aorta and IVC are grossly unremarkable. No portal venous gas. There is no adenopathy. Reproductive: The uterus is anteverted and grossly unremarkable. There is a 3 cm right ovarian dominant follicle or corpus luteum. The left ovary contains multiple physiologic follicles are otherwise unremarkable. Other: None Musculoskeletal: No acute or significant osseous findings. IMPRESSION: 1. Bilateral UVJ calculi measuring 2 mm on the left and 3 mm on the right with mild bilateral hydronephrosis. 2. Left-sided pyelonephritis with lobar nephronia in the upper pole. No drainable fluid collection or abscess. 3. Moderate amount of left perinephric fluid as well as free fluid in the upper abdomen and pelvis may be related to pyelonephritis or secondary to rupture of a left renal calyx and urine leak. 4. Pericholecystic fluid, periportal edema, and peripancreatic fluid may be related to pyelonephritis and possible calyceal rupture/urine leak. Correlation with liver and pancreatic enzymes recommended to exclude active inflammation. 5. Small bilateral pleural effusions. 6. A 15 mm enhancing lesion in the right lobe of the liver and a left adrenal nodule as described above and incompletely characterized. Further characterization with MRI on a nonemergent basis recommended. Electronically Signed   By: Elgie Collard M.D.   On: 07/14/2018 03:26   Dg Chest Port 1 View  Result Date: 07/13/2018 CLINICAL DATA:  Patient with fever. EXAM:  PORTABLE CHEST 1 VIEW COMPARISON:  None. FINDINGS: Normal cardiac and mediastinal contours. No  consolidative pulmonary opacities. No pleural effusion or pneumothorax. IMPRESSION: No acute cardiopulmonary process. Electronically Signed   By: Annia Belt M.D.   On: 07/13/2018 14:12   Dg C-arm 1-60 Min-no Report  Result Date: 07/14/2018 Fluoroscopy was utilized by the requesting physician.  No radiographic interpretation.      Scheduled Meds: Continuous Infusions: . sodium chloride Stopped (07/15/18 1230)  . sodium chloride Stopped (07/14/18 0741)  . ampicillin (OMNIPEN) IV Stopped (07/15/18 1312)  . famotidine (PEPCID) IV Stopped (07/15/18 1214)     LOS: 2 days    Time spent in minutes: 45    Calvert Cantor, MD [facility] Pager: [email or web address] Password [email or web address] 07/15/2018, 2:00 PM

## 2018-07-15 NOTE — Care Management (Addendum)
  Discharge Readiness Milestones  (1st Day of chart review)  MCG Used: urosepsis Optimal GLOS: 2days Hospital Day: 2  Discharge Readiness Milestones Goal Length of Stay: Ambulatory or 2 days  Note: Goal Length of Stay assumes optimal recovery, decision making, and care. Patients may be discharged to a lower level of care (either later than or sooner than the goal) when it is appropriate for their clinical status and care needs. Discharge Readiness Return to top of Urinary Tract Infection (UTI) RRG - [place]  Discharge readiness is indicated by patient meeting Recovery Milestones, including ALL of the following: ? Hemodynamic stability temp 100.0/hr-144/resp.33/o2 via [place] ? Fever absent or reduced no ? Vomiting absent or improved improved ? Urine output adequate  yes ? Renal function at baseline or acceptable for next level of care ? Pain absent or managed managed ? Ambulatory no ? Oral hydration, medications,[L] and diet ? Iv ns at kvo/iv ampicillin, iv pepcid/ urine culture + for e.coli. ?   Identified Barriers :1 Day Post-Op   Subjective/Chief Complaint:   1 - Bilateral Ureteral Stones -bilateral <52m UVJ stones with mild hydro by ER CT 07/2018 on eval flank pain. No addiitonal stones. Cr <1. Bilateral 5x22 stents place 9/3 for urgent renal decompression.   2 - Urosepsis- fevers, tachycardia, leukocytosis to 30k, and baceruria on intak labs c/w urosepsis. Placed on rocephiin, BCX, UCX 9/2 pan-sensitive e. Coli.   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 NO h/o refractory HTN or hypokalemia Date/Name case discussed with attending MD (if applicable):  Date/name case discussed with supervisor (if applicable):  Date/name case referred to physician advisor (if applicable)   Concurrent Review - Discharge Readiness Milestones, Not Met

## 2018-07-16 DIAGNOSIS — T7840XA Allergy, unspecified, initial encounter: Secondary | ICD-10-CM

## 2018-07-16 DIAGNOSIS — N390 Urinary tract infection, site not specified: Secondary | ICD-10-CM

## 2018-07-16 DIAGNOSIS — T7840XS Allergy, unspecified, sequela: Secondary | ICD-10-CM

## 2018-07-16 DIAGNOSIS — N200 Calculus of kidney: Secondary | ICD-10-CM

## 2018-07-16 DIAGNOSIS — A4151 Sepsis due to Escherichia coli [E. coli]: Principal | ICD-10-CM

## 2018-07-16 DIAGNOSIS — R6520 Severe sepsis without septic shock: Secondary | ICD-10-CM

## 2018-07-16 DIAGNOSIS — A419 Sepsis, unspecified organism: Secondary | ICD-10-CM

## 2018-07-16 LAB — CBC
HEMATOCRIT: 32.9 % — AB (ref 36.0–46.0)
Hemoglobin: 11.4 g/dL — ABNORMAL LOW (ref 12.0–15.0)
MCH: 30.8 pg (ref 26.0–34.0)
MCHC: 34.7 g/dL (ref 30.0–36.0)
MCV: 88.9 fL (ref 78.0–100.0)
Platelets: 270 10*3/uL (ref 150–400)
RBC: 3.7 MIL/uL — AB (ref 3.87–5.11)
RDW: 13 % (ref 11.5–15.5)
WBC: 27 10*3/uL — ABNORMAL HIGH (ref 4.0–10.5)

## 2018-07-16 LAB — BASIC METABOLIC PANEL
Anion gap: 7 (ref 5–15)
BUN: 15 mg/dL (ref 6–20)
CHLORIDE: 115 mmol/L — AB (ref 98–111)
CO2: 23 mmol/L (ref 22–32)
CREATININE: 0.82 mg/dL (ref 0.44–1.00)
Calcium: 8 mg/dL — ABNORMAL LOW (ref 8.9–10.3)
GFR calc Af Amer: >60 mL/min (ref >60)
GFR calc non Af Amer: >60 mL/min (ref >60)
Glucose, Bld: 113 mg/dL — ABNORMAL HIGH (ref 70–99)
POTASSIUM: 3.7 mmol/L (ref 3.5–5.1)
Sodium: 145 mmol/L (ref 135–145)

## 2018-07-16 MED ORDER — EPINEPHRINE 0.3 MG/0.3ML IJ SOAJ: 0.3000 mg | Freq: Once | INTRAMUSCULAR | 2 refills | Status: AC

## 2018-07-16 MED ORDER — DIPHENHYDRAMINE HCL 25 MG PO CAPS: 50.0000 mg | ORAL_CAPSULE | Freq: Four times a day (QID) | ORAL | 0 refills | Status: DC | PRN

## 2018-07-16 MED ORDER — AMOXICILLIN 500 MG PO CAPS: 500.0000 mg | ORAL_CAPSULE | Freq: Three times a day (TID) | ORAL | 0 refills | Status: DC

## 2018-07-16 MED ORDER — FAMOTIDINE 20 MG PO TABS: 20.0000 mg | ORAL_TABLET | Freq: Two times a day (BID) | ORAL | 1 refills | Status: DC | PRN

## 2018-07-16 MED ORDER — HYDROCODONE-ACETAMINOPHEN 5-325 MG PO TABS: 1.0000 | ORAL_TABLET | ORAL | 0 refills | Status: DC | PRN

## 2018-07-16 MED ORDER — IBUPROFEN 200 MG PO TABS: 400.0000 mg | ORAL_TABLET | Freq: Four times a day (QID) | ORAL | 0 refills | Status: DC | PRN

## 2018-07-16 MED ORDER — ONDANSETRON HCL 4 MG PO TABS: 4.0000 mg | ORAL_TABLET | Freq: Four times a day (QID) | ORAL | 0 refills | Status: DC | PRN

## 2018-07-16 MED ORDER — SENNOSIDES-DOCUSATE SODIUM 8.6-50 MG PO TABS: 1.0000 | ORAL_TABLET | Freq: Every evening | ORAL | Status: DC | PRN

## 2018-07-16 NOTE — Plan of Care (Signed)
  Problem: Safety: Goal: Ability to remain free from injury will improve Outcome: Progressing   

## 2018-07-16 NOTE — Progress Notes (Signed)
Dr Tora Kindred office will call pt to schedule surgery. Pt discharged home with instructions.

## 2018-07-16 NOTE — Discharge Summary (Signed)
Physician Discharge Summary  Kendra Morgan ZOX:[id number] DOB: 02-27-1996 DOA: 07/13/2018  PCP: Patient, No Pcp Per  Admit date: 07/13/2018 Discharge date: 07/16/2018  Admitted From: home Disposition:  home   Recommendations for Outpatient Follow-up:  1. F/u with allergist as needed if allergic reaction recurrs 2. F/u WBC count as outpt    Discharge Condition:  stable  CODE STATUS:  Full code   Consultants:   urology Procedures:   1. Cystoscopy with bilateral pyelograms, interpretation.  2. Insertion of bilateral ureteral stents 5 x 22 [place], no tether.   Discharge Diagnoses:  Principal Problem:   Pyelonephritis Active Problems:   Severe sepsis    Bilateral hydronephrosis   Nephrolithiasis   Allergic reaction    Brief Summary: Kendra Morgan 22 year old female with no past medical history presented with abdominal pain and left flank pain for 2 weeks for which she was treating herself with over-the-counter urinary medications.  Noted to have a fever of 102.6, tahcycardia, BP as low ast 76/54 at one point, WBC 30.3, and lactic acid of 2.9. CT abd/pelvis in ED: b/l nephrolithiasis with mild hydronephrosis, left pyelonephritis- see full report below.  She was admitted for sepsis secondary to pyelonephritis and started on intravenous antibiotics.  Hospital Course:  Severe Sepsis secondary to Pyelonephritis, bilateral nephrolithiasis with mild Hydronephrosis Nephrolithiasis  - s/p bilateral ureteral stents by Dr Berneice Heinrich on 9/3 - I have switched her from Ceftriaxone to Ampicillin based on the fact that the culture is growing E coli sensitive to Amp - foley  removed  yesterday  - blood cultures have remained negative - WBC count is improving and it 27 today - I have prescribed a course of Amoxil (total 14 day course) - she    A 15 mm enhancing lesion in the right lobe of the liver and left adrenal nodule  - noted incidentally on CT- MRI recommended as outpt which I have  discussed with her in detail   Allergic reaction? - right facial swelling started after I evaluated her yesterday AM and extended down the right neck- there was erythema which as spread from the face to the neck and upper chest- no swelling noted in neck or chest - edema and erythema improved after starting Benadryl and Pepcid IV - - no associated itching, swallowing or breathing difficulty noted - I am uncertain as to the cause of the reaction- she did not receive any new medications prior to the symptoms starting (last meds were the night before) - ? If it was related to food she received in the hospital- after asking her to bring in food from home, her reaction has abated - PRN Benadryl, Pepcid and EpiPen ordered for home use if there is a recurrence - she is recommended to f/u with an allergist if there is a recurrence     Discharge Exam: Vitals:   07/16/18 0320 07/16/18 0800  BP:  124/80  Pulse:  88  Resp:  (!) 28  Temp: 98.1 F (36.7 C) 98.1 F (36.7 C)  SpO2:  95%   Vitals:   07/15/18 2312 07/16/18 0000 07/16/18 0320 07/16/18 0800  BP:  (!) 101/56  124/80  Pulse:  80  88  Resp:  (!) 22  (!) 28  Temp: 98.6 F (37 C)  98.1 F (36.7 C) 98.1 F (36.7 C)  TempSrc: Oral  Oral Oral  SpO2:  90%  95%  Weight:      Height:  General: Pt is alert, awake, not in acute distress Cardiovascular: RRR, S1/S2 +, no rubs, no gallops Respiratory: CTA bilaterally, no wheezing, no rhonchi Abdominal: Soft, NT, ND, bowel sounds + Extremities: no edema, no cyanosis   Discharge Instructions  Discharge Instructions    Diet - low sodium heart healthy   Complete by:  As directed    Increase activity slowly   Complete by:  As directed      Allergies as of 07/16/2018   No Known Allergies     Medication List    STOP taking these medications   acetaminophen 500 MG tablet Commonly known as:  TYLENOL     TAKE these medications   amoxicillin 500 MG capsule Commonly known  as:  AMOXIL Take 1 capsule (500 mg total) by mouth 3 (three) times daily.   diphenhydrAMINE 25 mg capsule Commonly known as:  BENADRYL Take 2 capsules (50 mg total) by mouth every 6 (six) hours as needed for allergies.   EPINEPHrine 0.3 mg/0.3 mL Soaj injection Commonly known as:  EPI-PEN Inject 0.3 mLs (0.3 mg total) into the muscle once for 1 dose.   famotidine 20 MG tablet Commonly known as:  PEPCID Take 1 tablet (20 mg total) by mouth 2 (two) times daily as needed (allergic reaction).   HYDROcodone-acetaminophen 5-325 MG tablet Commonly known as:  NORCO/VICODIN Take 1 tablet by mouth every 4 (four) hours as needed for moderate pain.   ibuprofen 200 MG tablet Commonly known as:  ADVIL,MOTRIN Take 2 tablets (400 mg total) by mouth every 6 (six) hours as needed for moderate pain. What changed:  how much to take   ondansetron 4 MG tablet Commonly known as:  ZOFRAN Take 1 tablet (4 mg total) by mouth every 6 (six) hours as needed for nausea.   senna-docusate 8.6-50 MG tablet Commonly known as:  Senokot-S Take 1 tablet by mouth at bedtime as needed for mild constipation.   URISTAT 95 MG tablet Generic drug:  phenazopyridine Take 95 mg by mouth 3 (three) times daily as needed for pain.      Follow-up Information    [facility] Follow up.   Contact information: [address] [place] [address]       [facility]   Specialty:  Allergy and Immunology Contact information: [address] [place] [address] [phone]          No Known Allergies   Procedures/Studies:    Ct Abdomen Pelvis W Contrast  Result Date: 07/14/2018 CLINICAL DATA:  22 year old female with pyelonephritis, abdominal pain, nausea vomiting. EXAM: CT ABDOMEN AND PELVIS WITH CONTRAST TECHNIQUE: Multidetector CT imaging of the abdomen and pelvis was performed using the standard protocol following bolus  administration of intravenous contrast. CONTRAST:  OMNIPAQUE IOHEXOL 300 MG/ML  SOLN COMPARISON:  None. FINDINGS: Lower chest: There are partially visualized small bilateral pleural effusions and associated partial subsegmental atelectasis of the lung bases. There is no intra-abdominal free air. There is moderate free fluid within the pelvis as well as small upper abdominal and perihepatic free fluid. Hepatobiliary: There is a 15 x 14 mm enhancing lesion in the dome of the liver (series 2, image 12) which is not characterized but may represent a flash filling hemangioma or an area of portal venous shunting. Further evaluation with MRI on a nonemergent basis recommended. There is diffuse periportal edema. Mild diffuse heterogeneity of the liver parenchyma may be related  to edema correlation with liver function test recommended. No calcified gallstone identified. There is diffuse thickening of the gallbladder wall with small pericholecystic fluid. Pancreas: The pancreas is unremarkable. Small amount of fluid along the posterior and inferior aspect of the pancreas likely extension from the systemic process. Correlation with pancreatic enzymes recommended to exclude pancreatitis. Spleen: Normal in size without focal abnormality. Adrenals/Urinary Tract: The right adrenal gland is unremarkable. There is a 2.8 x 2.4 cm heterogeneously enhancing nodule from the left adrenal gland which is not well characterized. Tiny areas of lower attenuation in this nodule may contain fatty tissue and therefore this nodule likely represents an adenoma or myelolipoma. Further characterization with MRI on a nonemergent basis recommended. There is heterogeneous enhancement of the left kidney with patchy areas of hypoenhancement consistent with pyelonephritis. There is a focal area of decreased enhancement in the superior pole of the left kidney likely representing lobar nephronia. No drainable fluid collection or abscess identified at  this time. There is a 2 mm nonobstructing left renal inferior pole calculus. There is moderate amount of left perinephric fluid. There is a 2 mm stone at the left ureterovesical junction with mild left hydronephrosis. There is a 3 mm stone at the right ureterovesical junction with mild right hydronephrosis. The urinary bladder is predominantly collapsed. Stomach/Bowel: There is no bowel obstruction. Thickened appearance of the colon likely related to underdistention. The appendix is normal. Vascular/Lymphatic: The abdominal aorta and IVC are grossly unremarkable. No portal venous gas. There is no adenopathy. Reproductive: The uterus is anteverted and grossly unremarkable. There is a 3 cm right ovarian dominant follicle or corpus luteum. The left ovary contains multiple physiologic follicles are otherwise unremarkable. Other: None Musculoskeletal: No acute or significant osseous findings. IMPRESSION: 1. Bilateral UVJ calculi measuring 2 mm on the left and 3 mm on the right with mild bilateral hydronephrosis. 2. Left-sided pyelonephritis with lobar nephronia in the upper pole. No drainable fluid collection or abscess. 3. Moderate amount of left perinephric fluid as well as free fluid in the upper abdomen and pelvis may be related to pyelonephritis or secondary to rupture of a left renal calyx and urine leak. 4. Pericholecystic fluid, periportal edema, and peripancreatic fluid may be related to pyelonephritis and possible calyceal rupture/urine leak. Correlation with liver and pancreatic enzymes recommended to exclude active inflammation. 5. Small bilateral pleural effusions. 6. A 15 mm enhancing lesion in the right lobe of the liver and a left adrenal nodule as described above and incompletely characterized. Further characterization with MRI on a nonemergent basis recommended. Electronically Signed   By: Elgie Collard M.D.   On: 07/14/2018 03:26   Dg Chest Port 1 View  Result Date: 07/13/2018 CLINICAL DATA:   Patient with fever. EXAM: PORTABLE CHEST 1 VIEW COMPARISON:  None. FINDINGS: Normal cardiac and mediastinal contours. No consolidative pulmonary opacities. No pleural effusion or pneumothorax. IMPRESSION: No acute cardiopulmonary process. Electronically Signed   By: Annia Belt M.D.   On: 07/13/2018 14:12   Dg C-arm 1-60 Min-no Report  Result Date: 07/14/2018 Fluoroscopy was utilized by the requesting physician.  No radiographic interpretation.     The results of significant diagnostics from this hospitalization (including imaging, microbiology, ancillary and laboratory) are listed below for reference.     Microbiology: Recent Results (from the past 240 hour(s))  Urine Culture     Status: Abnormal   Collection Time: 07/13/18 12:09 PM  Result Value Ref Range Status   Specimen Description   Final  URINE, CLEAN CATCH Performed at [facility], [address] [place], [place] [address]    Special Requests   Final    NONE Performed at [facility], [address], [place], [place] [address]    Culture >=100,000 COLONIES/mL ESCHERICHIA COLI (A)  Final   Report Status 07/15/2018 FINAL  Final   Organism ID, Bacteria ESCHERICHIA COLI (A)  Final      Susceptibility   Escherichia coli - MIC*    AMPICILLIN <=2 SENSITIVE Sensitive     CEFAZOLIN <=4 SENSITIVE Sensitive     CEFTRIAXONE <=1 SENSITIVE Sensitive     CIPROFLOXACIN <=0.25 SENSITIVE Sensitive     GENTAMICIN <=1 SENSITIVE Sensitive     IMIPENEM <=0.25 SENSITIVE Sensitive     NITROFURANTOIN <=16 SENSITIVE Sensitive     TRIMETH/SULFA <=20 SENSITIVE Sensitive     AMPICILLIN/SULBACTAM <=2 SENSITIVE Sensitive     PIP/TAZO <=4 SENSITIVE Sensitive     Extended ESBL NEGATIVE Sensitive     * >=100,000 COLONIES/mL ESCHERICHIA COLI  Culture, blood (x 2)     Status: None (Preliminary result)   Collection Time: 07/13/18  1:28 PM  Result Value Ref Range Status   Specimen Description   Final    BLOOD LEFT ARM Performed at [facility], [address] [place], [place] [address]    Special Requests   Final    BOTTLES DRAWN AEROBIC AND ANAEROBIC Blood Culture adequate volume Performed at [facility], [address], [place], [place] [address]    Culture   Final    NO GROWTH 3 DAYS Performed at [facility], [address], [place], [place] [address]    Report Status PENDING  Incomplete  Culture, blood (x 2)     Status: None (Preliminary result)   Collection Time: 07/13/18  1:37 PM  Result Value Ref Range Status   Specimen Description   Final    BLOOD LEFT ARM Performed at [facility], [address] [place], [place] [address]    Special Requests   Final    BOTTLES DRAWN AEROBIC ONLY Blood Culture adequate volume Performed at [facility], [address], [place], [place] [address]    Culture   Final    NO GROWTH 3 DAYS Performed at [facility], [address] [place], [place] [address]    Report Status PENDING  Incomplete  MRSA PCR Screening     Status: None   Collection Time: 07/13/18  6:09 PM  Result Value Ref Range Status   MRSA by PCR NEGATIVE NEGATIVE Final    Comment:        The GeneXpert MRSA Assay (FDA approved for NASAL specimens only), is one component of a comprehensive MRSA colonization surveillance program. It is not intended to diagnose MRSA infection nor to guide or monitor treatment for MRSA infections. Performed at [facility], [address] [place], [place] [address]      Labs: BNP (last 3 results) No results for input(s): BNP in the last 8760 hours. Basic Metabolic Panel: Recent Labs  Lab 07/13/18 1337 07/14/18 0359 07/15/18 0341 07/16/18 0729  NA 137 141 144 145  K 3.6 3.3* 3.8 3.7  CL 108 115* 115* 115*  CO2 20* 18* 21* 23  GLUCOSE 123* 130* 157* 113*  BUN 9 8 11 15   CREATININE 0.71 0.77 0.75 0.82  CALCIUM 7.9* 7.5* 8.3* 8.0*  MG  --   --  1.7  --    Liver Function  Tests: Recent Labs  Lab 07/13/18 1337  07/15/18 0341  AST 15 18  ALT 11 16  ALKPHOS 55 68  BILITOT 1.2 0.5  PROT 6.1* 4.9*  ALBUMIN 3.6 2.4*   No results for input(s): LIPASE, AMYLASE in the last 168 hours. No results for input(s): AMMONIA in the last 168 hours. CBC: Recent Labs  Lab 07/13/18 1241 07/14/18 0359 07/15/18 0341 07/16/18 0729  WBC 30.3* 27.9* 28.6* 27.0*  NEUTROABS 27.0*  --  26.2*  --   HGB 12.2 11.7* 11.3* 11.4*  HCT 35.4* 34.7* 33.2* 32.9*  MCV 90.1 91.1 89.5 88.9  PLT 225 199 213 270   Cardiac Enzymes: No results for input(s): CKTOTAL, CKMB, CKMBINDEX, TROPONINI in the last 168 hours. BNP: Invalid input(s): POCBNP CBG: No results for input(s): GLUCAP in the last 168 hours. D-Dimer No results for input(s): DDIMER in the last 72 hours. Hgb A1c No results for input(s): HGBA1C in the last 72 hours. Lipid Profile No results for input(s): CHOL, HDL, LDLCALC, TRIG, CHOLHDL, LDLDIRECT in the last 72 hours. Thyroid function studies No results for input(s): TSH, T4TOTAL, T3FREE, THYROIDAB in the last 72 hours.  Invalid input(s): FREET3 Anemia work up No results for input(s): VITAMINB12, FOLATE, FERRITIN, TIBC, IRON, RETICCTPCT in the last 72 hours. Urinalysis    Component Value Date/Time   COLORURINE AMBER (A) 07/13/2018 1209   APPEARANCEUR HAZY (A) 07/13/2018 1209   LABSPEC 1.020 07/13/2018 1209   PHURINE 6.0 07/13/2018 1209   GLUCOSEU NEGATIVE 07/13/2018 1209   HGBUR SMALL (A) 07/13/2018 1209   BILIRUBINUR NEGATIVE 07/13/2018 1209   KETONESUR 20 (A) 07/13/2018 1209   PROTEINUR 100 (A) 07/13/2018 1209   UROBILINOGEN 0.2 04/12/2011 1441   NITRITE POSITIVE (A) 07/13/2018 1209   LEUKOCYTESUR SMALL (A) 07/13/2018 1209   Sepsis Labs Invalid input(s): PROCALCITONIN,  WBC,  LACTICIDVEN Microbiology Recent Results (from the past 240 hour(s))  Urine Culture     Status: Abnormal   Collection Time: 07/13/18 12:09 PM  Result Value Ref Range Status   Specimen Description   Final    URINE,  CLEAN CATCH Performed at [facility], [address], [place], [place] [address]    Special Requests   Final    NONE Performed at [facility], [address], [place], [place] [address]    Culture >=100,000 COLONIES/mL ESCHERICHIA COLI (A)  Final   Report Status 07/15/2018 FINAL  Final   Organism ID, Bacteria ESCHERICHIA COLI (A)  Final      Susceptibility   Escherichia coli - MIC*    AMPICILLIN <=2 SENSITIVE Sensitive     CEFAZOLIN <=4 SENSITIVE Sensitive     CEFTRIAXONE <=1 SENSITIVE Sensitive     CIPROFLOXACIN <=0.25 SENSITIVE Sensitive     GENTAMICIN <=1 SENSITIVE Sensitive     IMIPENEM <=0.25 SENSITIVE Sensitive     NITROFURANTOIN <=16 SENSITIVE Sensitive     TRIMETH/SULFA <=20 SENSITIVE Sensitive     AMPICILLIN/SULBACTAM <=2 SENSITIVE Sensitive     PIP/TAZO <=4 SENSITIVE Sensitive     Extended ESBL NEGATIVE Sensitive     * >=100,000 COLONIES/mL ESCHERICHIA COLI  Culture, blood (x 2)     Status: None (Preliminary result)   Collection Time: 07/13/18  1:28 PM  Result Value Ref Range Status   Specimen Description   Final    BLOOD LEFT ARM Performed at [facility], [address] [place], [place] [address]    Special Requests   Final    BOTTLES DRAWN AEROBIC AND ANAEROBIC Blood  Culture adequate volume Performed at [facility], [address] [place], [place] [address]    Culture   Final    NO GROWTH 3 DAYS Performed at [facility], [address], [place], [place] [address]    Report Status PENDING  Incomplete  Culture, blood (x 2)     Status: None (Preliminary result)   Collection Time: 07/13/18  1:37 PM  Result Value Ref Range Status   Specimen Description   Final    BLOOD LEFT ARM Performed at [facility], [address] [place], [place] [address]    Special Requests   Final    BOTTLES DRAWN AEROBIC ONLY Blood Culture adequate volume Performed at [facility], [address], [place], [place] [address]    Culture   Final     NO GROWTH 3 DAYS Performed at [facility], [address], [place], [place] [address]    Report Status PENDING  Incomplete  MRSA PCR Screening     Status: None   Collection Time: 07/13/18  6:09 PM  Result Value Ref Range Status   MRSA by PCR NEGATIVE NEGATIVE Final    Comment:        The GeneXpert MRSA Assay (FDA approved for NASAL specimens only), is one component of a comprehensive MRSA colonization surveillance program. It is not intended to diagnose MRSA infection nor to guide or monitor treatment for MRSA infections. Performed at [facility], [address], [place], [place] [address]      Time coordinating discharge in minutes: 60  SIGNED:   Calvert Cantor, MD  [facility] 07/16/2018, 9:54 AM Pager   If 7PM-7AM, please contact night-coverage [email or web address] Password [email or web address]

## 2018-07-16 NOTE — Discharge Instructions (Signed)
Your CT scan report shows the following:  A 15 mm enhancing lesion in the right lobe of the liver and left adrenal nodule. An MRI is recommended to follow up on these findings and ensure that you do not have a serious issue. Please have your primary care doctor order this.    You were cared for by a hospitalist during your hospital stay. If you have any questions about your discharge medications or the care you received while you were in the hospital after you are discharged, you can call the unit and asked to speak with the hospitalist on call if the hospitalist that took care of you is not available. Once you are discharged, your primary care physician will handle any further medical issues.   Please note that NO REFILLS for any discharge medications will be authorized once you are discharged, as it is imperative that you return to your primary care physician (or establish a relationship with a primary care physician if you do not have one) for your aftercare needs so that they can reassess your need for medications and monitor your lab values.  Please take all your medications with you for your next visit with your Primary MD. Please ask your Primary MD to get all Hospital records sent to his/her office. Please request your Primary MD to go over all hospital test results at the follow up.   If you experience worsening of your admission symptoms, develop shortness of breath, chest pain, suicidal or homicidal thoughts or a life threatening emergency, you must seek medical attention immediately by calling 911 or calling your MD.   Bonita Quin must read the complete instructions/literature along with all the possible adverse reactions/side effects for all the medicines you take including new medications that have been prescribed to you. Take new medicines after you have completely understood and accpet all the possible adverse reactions/side effects.    Do not drive when taking pain medications or sedatives.       Do not take more than prescribed Pain, Sleep and Anxiety Medications   If you have smoked or chewed Tobacco in the last 2 yrs please stop. Stop any regular alcohol  and or recreational drug use.   Wear Seat belts while driving.    Pyelonephritis, Adult Pyelonephritis is a kidney infection. The kidneys are organs that help clean your blood by moving waste out of your blood and into your pee (urine). This infection can happen quickly, or it can last for a long time. In most cases, it clears up with treatment and does not cause other problems. Follow these instructions at home: Medicines  Take over-the-counter and prescription medicines only as told by your doctor.  Take your antibiotic medicine as told by your doctor. Do not stop taking the medicine even if you start to feel better. General instructions  Drink enough fluid to keep your pee clear or pale yellow.  Avoid caffeine, tea, and carbonated drinks.  Pee (urinate) often. Avoid holding in pee for long periods of time.  Pee before and after sex.  After pooping (having a bowel movement), women should wipe from front to back. Use each tissue only once.  Keep all follow-up visits as told by your doctor. This is important. Contact a doctor if:  You do not feel better after 2 days.  Your symptoms get worse.  You have a fever. Get help right away if:  You cannot take your medicine or drink fluids as told.  You have chills and  shaking.  You throw up (vomit).  You have very bad pain in your side (flank) or back.  You feel very weak or you pass out (faint). This information is not intended to replace advice given to you by your health care provider. Make sure you discuss any questions you have with your health care provider. Document Released: 12/05/2004 Document Revised: 04/04/2016 Document Reviewed: 02/20/2015 Elsevier Interactive Patient Education  [employer].

## 2018-07-18 LAB — CULTURE, BLOOD (ROUTINE X 2)
Culture: NO GROWTH
Culture: NO GROWTH
Special Requests: ADEQUATE
Special Requests: ADEQUATE

## 2018-07-22 ENCOUNTER — Other Ambulatory Visit: Payer: Self-pay | Admitting: Urology

## 2018-07-29 ENCOUNTER — Encounter (HOSPITAL_BASED_OUTPATIENT_CLINIC_OR_DEPARTMENT_OTHER): Payer: Self-pay | Admitting: *Deleted

## 2018-07-31 ENCOUNTER — Other Ambulatory Visit: Payer: Self-pay

## 2018-07-31 ENCOUNTER — Encounter (HOSPITAL_BASED_OUTPATIENT_CLINIC_OR_DEPARTMENT_OTHER): Payer: Self-pay | Admitting: *Deleted

## 2018-07-31 NOTE — Progress Notes (Signed)
Spoke w/ pt via phone for pre-op interview.  Npo after mn.  Arrive at 0930.  Needs istat 8 and urine preg.  Will take zyrtec and pepcid am dos w/ sips of water and if needed may take hydrocodone/ zofran.

## 2018-08-05 ENCOUNTER — Other Ambulatory Visit: Payer: Self-pay

## 2018-08-05 ENCOUNTER — Encounter (HOSPITAL_BASED_OUTPATIENT_CLINIC_OR_DEPARTMENT_OTHER): Payer: Self-pay | Admitting: Anesthesiology

## 2018-08-05 ENCOUNTER — Ambulatory Visit (HOSPITAL_BASED_OUTPATIENT_CLINIC_OR_DEPARTMENT_OTHER)
Admission: RE | Admit: 2018-08-05 | Discharge: 2018-08-05 | Disposition: A | Discharge status: Home / Self Care | Payer: Medicaid Other | Source: Ambulatory Visit | Attending: Urology | Admitting: Urology

## 2018-08-05 ENCOUNTER — Ambulatory Visit (HOSPITAL_BASED_OUTPATIENT_CLINIC_OR_DEPARTMENT_OTHER): Payer: Medicaid Other | Admitting: Anesthesiology

## 2018-08-05 ENCOUNTER — Encounter (HOSPITAL_BASED_OUTPATIENT_CLINIC_OR_DEPARTMENT_OTHER)
Admission: RE | Disposition: A | Discharge status: Home / Self Care | Payer: Self-pay | Source: Ambulatory Visit | Attending: Urology

## 2018-08-05 DIAGNOSIS — N201 Calculus of ureter: Secondary | ICD-10-CM | POA: Insufficient documentation

## 2018-08-05 DIAGNOSIS — K219 Gastro-esophageal reflux disease without esophagitis: Secondary | ICD-10-CM | POA: Insufficient documentation

## 2018-08-05 DIAGNOSIS — E279 Disorder of adrenal gland, unspecified: Secondary | ICD-10-CM | POA: Insufficient documentation

## 2018-08-05 HISTORY — DX: Calculus of ureter: N20.1

## 2018-08-05 HISTORY — DX: Presence of spectacles and contact lenses: Z97.3

## 2018-08-05 HISTORY — PX: CYSTOSCOPY WITH RETROGRADE PYELOGRAM, URETEROSCOPY AND STENT PLACEMENT: SHX5789

## 2018-08-05 HISTORY — DX: Gastro-esophageal reflux disease without esophagitis: K21.9

## 2018-08-05 HISTORY — DX: Urgency of urination: R39.15

## 2018-08-05 LAB — POCT I-STAT, CHEM 8
BUN: 7 mg/dL (ref 6–20)
CALCIUM ION: 1.26 mmol/L (ref 1.15–1.40)
CHLORIDE: 104 mmol/L (ref 98–111)
Creatinine, Ser: 0.7 mg/dL (ref 0.44–1.00)
GLUCOSE: 87 mg/dL (ref 70–99)
HCT: 37 % (ref 36.0–46.0)
Hemoglobin: 12.6 g/dL (ref 12.0–15.0)
Potassium: 3.9 mmol/L (ref 3.5–5.1)
Sodium: 139 mmol/L (ref 135–145)
TCO2: 22 mmol/L (ref 22–32)

## 2018-08-05 LAB — POCT PREGNANCY, URINE: PREG TEST UR: NEGATIVE

## 2018-08-05 SURGERY — CYSTOURETEROSCOPY, WITH RETROGRADE PYELOGRAM AND STENT INSERTION
Anesthesia: General | Laterality: Bilateral

## 2018-08-05 MED ORDER — PROPOFOL 10 MG/ML IV BOLUS
INTRAVENOUS | Status: AC
  Filled 2018-08-05: qty 20

## 2018-08-05 MED ORDER — KETOROLAC TROMETHAMINE 30 MG/ML IJ SOLN
INTRAMUSCULAR | Status: DC | PRN
  Administered 2018-08-05: 30 mg via INTRAVENOUS

## 2018-08-05 MED ORDER — OXYCODONE HCL 5 MG/5ML PO SOLN
5.0000 mg | Freq: Once | ORAL | Status: DC | PRN
  Filled 2018-08-05: qty 5

## 2018-08-05 MED ORDER — CEPHALEXIN 500 MG PO CAPS: 500.0000 mg | ORAL_CAPSULE | Freq: Two times a day (BID) | ORAL | 0 refills | Status: DC

## 2018-08-05 MED ORDER — DEXAMETHASONE SODIUM PHOSPHATE 4 MG/ML IJ SOLN
INTRAMUSCULAR | Status: DC | PRN
  Administered 2018-08-05: 10 mg via INTRAVENOUS

## 2018-08-05 MED ORDER — KETOROLAC TROMETHAMINE 10 MG PO TABS: 10.0000 mg | ORAL_TABLET | Freq: Three times a day (TID) | ORAL | 0 refills | Status: DC | PRN

## 2018-08-05 MED ORDER — FENTANYL CITRATE (PF) 100 MCG/2ML IJ SOLN
INTRAMUSCULAR | Status: DC | PRN
  Administered 2018-08-05: 50 ug via INTRAVENOUS
  Administered 2018-08-05 (×6): 25 ug via INTRAVENOUS

## 2018-08-05 MED ORDER — LACTATED RINGERS IV SOLN
INTRAVENOUS | Status: DC
  Administered 2018-08-05 (×2): via INTRAVENOUS
  Filled 2018-08-05: qty 1000

## 2018-08-05 MED ORDER — GENTAMICIN SULFATE 40 MG/ML IJ SOLN
5.0000 mg/kg | INTRAVENOUS | Status: DC
  Filled 2018-08-05: qty 7

## 2018-08-05 MED ORDER — KETOROLAC TROMETHAMINE 30 MG/ML IJ SOLN
INTRAMUSCULAR | Status: AC
  Filled 2018-08-05: qty 1

## 2018-08-05 MED ORDER — OXYCODONE HCL 5 MG PO TABS
5.0000 mg | ORAL_TABLET | Freq: Once | ORAL | Status: DC | PRN
  Filled 2018-08-05: qty 1

## 2018-08-05 MED ORDER — LIDOCAINE 2% (20 MG/ML) 5 ML SYRINGE
INTRAMUSCULAR | Status: DC | PRN
  Administered 2018-08-05: 60 mg via INTRAVENOUS

## 2018-08-05 MED ORDER — MIDAZOLAM HCL 2 MG/2ML IJ SOLN
INTRAMUSCULAR | Status: AC
  Filled 2018-08-05: qty 2

## 2018-08-05 MED ORDER — PROPOFOL 10 MG/ML IV BOLUS
INTRAVENOUS | Status: DC | PRN
  Administered 2018-08-05: 50 mg via INTRAVENOUS
  Administered 2018-08-05: 150 mg via INTRAVENOUS

## 2018-08-05 MED ORDER — DEXAMETHASONE SODIUM PHOSPHATE 10 MG/ML IJ SOLN
INTRAMUSCULAR | Status: AC
  Filled 2018-08-05: qty 1

## 2018-08-05 MED ORDER — HYDROMORPHONE HCL 1 MG/ML IJ SOLN
0.2500 mg | INTRAMUSCULAR | Status: DC | PRN
  Filled 2018-08-05: qty 0.5

## 2018-08-05 MED ORDER — SODIUM CHLORIDE 0.9 % IR SOLN
Status: DC | PRN
  Administered 2018-08-05: 3000 mL

## 2018-08-05 MED ORDER — FENTANYL CITRATE (PF) 100 MCG/2ML IJ SOLN
INTRAMUSCULAR | Status: AC
  Filled 2018-08-05: qty 2

## 2018-08-05 MED ORDER — ONDANSETRON HCL 4 MG/2ML IJ SOLN
INTRAMUSCULAR | Status: DC | PRN
  Administered 2018-08-05: 4 mg via INTRAVENOUS

## 2018-08-05 MED ORDER — PROMETHAZINE HCL 25 MG/ML IJ SOLN
6.2500 mg | INTRAMUSCULAR | Status: DC | PRN
  Filled 2018-08-05: qty 1

## 2018-08-05 MED ORDER — ONDANSETRON HCL 4 MG/2ML IJ SOLN
INTRAMUSCULAR | Status: AC
  Filled 2018-08-05: qty 4

## 2018-08-05 MED ORDER — MEPERIDINE HCL 25 MG/ML IJ SOLN
6.2500 mg | INTRAMUSCULAR | Status: DC | PRN
  Filled 2018-08-05: qty 1

## 2018-08-05 MED ORDER — MIDAZOLAM HCL 5 MG/5ML IJ SOLN
INTRAMUSCULAR | Status: DC | PRN
  Administered 2018-08-05: 2 mg via INTRAVENOUS

## 2018-08-05 MED ORDER — LIDOCAINE 2% (20 MG/ML) 5 ML SYRINGE
INTRAMUSCULAR | Status: AC
  Filled 2018-08-05: qty 5

## 2018-08-05 MED ORDER — GENTAMICIN SULFATE 40 MG/ML IJ SOLN
5.0000 mg/kg | INTRAVENOUS | Status: AC
  Administered 2018-08-05: 270 mg via INTRAVENOUS
  Filled 2018-08-05: qty 6.75

## 2018-08-05 MED ORDER — LACTATED RINGERS IV SOLN
INTRAVENOUS | Status: DC
  Filled 2018-08-05: qty 1000

## 2018-08-05 MED ORDER — HYDROCODONE-ACETAMINOPHEN 5-325 MG PO TABS: 1.0000 | ORAL_TABLET | ORAL | 0 refills | Status: DC | PRN

## 2018-08-05 SURGICAL SUPPLY — 24 items
BAG DRAIN URO-CYSTO SKYTR STRL (DRAIN) ×2 IMPLANT
BASKET LASER NITINOL 1.9FR (BASKET) ×2 IMPLANT
CATH INTERMIT  6FR 70CM (CATHETERS) ×2 IMPLANT
CLOTH BEACON ORANGE TIMEOUT ST (SAFETY) ×2 IMPLANT
FIBER LASER FLEXIVA 365 (UROLOGICAL SUPPLIES) IMPLANT
FIBER LASER TRAC TIP (UROLOGICAL SUPPLIES) IMPLANT
GLOVE BIO SURGEON STRL SZ7.5 (GLOVE) ×2 IMPLANT
GOWN STRL REUS W/TWL LRG LVL3 (GOWN DISPOSABLE) ×2 IMPLANT
GUIDEWIRE ANG ZIPWIRE 038X150 (WIRE) ×2 IMPLANT
GUIDEWIRE STR DUAL SENSOR (WIRE) ×2 IMPLANT
INFUSOR MANOMETER BAG 3000ML (MISCELLANEOUS) IMPLANT
IV NS 1000ML (IV SOLUTION)
IV NS 1000ML BAXH (IV SOLUTION) IMPLANT
IV NS IRRIG 3000ML ARTHROMATIC (IV SOLUTION) ×2 IMPLANT
KIT TURNOVER CYSTO (KITS) ×2 IMPLANT
MANIFOLD NEPTUNE II (INSTRUMENTS) ×2 IMPLANT
NS IRRIG 500ML POUR BTL (IV SOLUTION) ×2 IMPLANT
PACK CYSTO (CUSTOM PROCEDURE TRAY) ×2 IMPLANT
SHEATH ACCESS URETERAL 24CM (SHEATH) ×2 IMPLANT
STENT POLARIS 5FRX22 (STENTS) ×4 IMPLANT
SYR 10ML LL (SYRINGE) ×2 IMPLANT
TUBE CONNECTING 12X1/4 (SUCTIONS) IMPLANT
TUBE FEEDING 8FR 16IN STR KANG (MISCELLANEOUS) IMPLANT
TUBING UROLOGY SET (TUBING) ×2 IMPLANT

## 2018-08-05 NOTE — Op Note (Signed)
NAME: Kendra Morgan, CARDAMONE MEDICAL RECORD IO:[id number] ACCOUNT [id number] DATE OF BIRTH:01/02/1996 FACILITY: [facility] LOCATION: [facility] PHYSICIAN:Jaylin Benzel, MD  OPERATIVE REPORT  DATE OF PROCEDURE:  08/05/2018  PREOPERATIVE DIAGNOSIS:  Bilateral ureteral stones, status post prior stenting.  PROCEDURE: 1.  Cystoscopy with bilateral pyelograms, interpretation. 2.  Bilateral ureteroscopy with basketing of stones. 3.  Exchange of bilateral ureteral stents, 5 x 22 Polaris with tether.  ESTIMATED BLOOD LOSS:  Nil.  COMPLICATIONS:  None.  SPECIMENS:  Bilateral ureteral and renal stone fragments for analysis.  FINDINGS: 1.  Left mid ureteral stone estimated at 3 mm. 2.  Left papillary tip calcification x2.  Estimated 1-2 mm each. 3.  Resolution of prior right ureteral stone. 4.  Small papillary tip calcifications on the right. 5.  Complete resolution of all stone fragments larger than one-third millimeter accessible within bilateral kidneys and ureters. 6.  Distal replacement, bilateral ureteral stents proximal and renal pelvis, distally in urinary bladder.  INDICATIONS:  The patient is a pleasant 22 year old young woman who was found in workup for colicky flank pain to have bilateral ureteral stones earlier this month.  She underwent urgent stenting at that time to allow for renal decompression.  She now  presents for definitive stone management with a goal of stone free.  Informed consent was obtained and placed in medical record.  The patient identified as Kendra Morgan confirmed.  Procedure being bilateral ureteroscopic stimulation was confirmed.  Procedure timeout was performed.  Intravenous antibiotics administered.  General anesthesia induced.  The patient was placed into a low  lithotomy position, sterile field was created by prepping and draping the patient's vagina, introitus and proximal thighs using iodine.  Cystourethroscopy was performed using a 22-French rigid cystoscope  vessel lens.  Inspection of the urinary bladder  revealed no diverticula, calcifications or palpable lesions.  The distal end of bilateral stents were seen in situ.  The distal end of the left stent was grasped, brought to the level of the urethral meatus.  A 0.038 ZIPwire was advanced to lower pole  and set aside as a safety wire.  This was exchanged for an open-ended catheter and left retrograde pyelogram was obtained.  Left retrograde pyelogram demonstrated a single left ureter with single system left kidney.  There were no obvious filling defects or narrowing noted at this point.  The ZIPwire was once again advanced and set aside as a safety wire on the left.   Similarly, the right ureteral stent was grasped, brought to the level of the urethral meatus.  A separate 0.038 ZIPwire was advanced to lower pole and set aside as a safety wire.  It was exchanged for an open-ended catheter and right pyelogram was  obtained.  Right retrograde pyelogram demonstrated a single right ureter, single system right kidney.  No filling defects or narrowing noted.  The ZIPwire was once again advanced as a safety wire.  An 8-French feeding tube was placed in the urinary bladder for  pressure release, and semirigid ureteroscopy was performed of the distal 4th distal right ureter alongside a separate sensor working wire.  No mucosal abnormalities were found.  Similarly semirigid ureteroscopy was performed of the distal ureter, distal  orifice.  There was a single calcification noted in the midureter consistent with known stone.  This did appear amenable to simple basketing.  It was grasped with the long axis, removed and set aside for composition analysis as the goal today was to  verify bilateral stone free.  The semirigid scope  was exchanged for a 12/14 x 28 cm ureteral access sheath and the left  using continuous fluoroscopic guidance and flexible digital ureteroscopy performed in the left proximal ureter and systematic   inspection of the kidney, including all calices x3.  There were 2 smaller separate papillary tip calcifications, one approximately 2 mm and another one approximately 1 mm.  These were amenable to simple basketing.  They were sequentially removed and set  aside for composition analysis.  Following these maneuvers, excellent hemostasis.  No evidence of renal perforation.  There was complete resolution of all accessible stone fragments larger than 130 mm on the left side.  The access sheath was removed  under continuous vision.  The access sheath was then placed over the right sensor working wire to the level of the proximal right ureter using continuous fluoroscopic guidance and parked in the same apartment and flexible digital ureteroscopy performed  the proximal right ureter and systematic inspection of the right kidney, including all calices x3.  There was a single dominant papillary tip calcifications noted.  It was quite small.  It was also amenable to simple basketing.  It was removed and set  aside for composition analysis.  The access sheath was removed under continuous vision, no mucosal abnormalities were found.  Given the bilateral nature of the procedure, it was felt that brief interval stenting without tether stenting would be  warranted.  As such 5 x 22 Polaris-type stents were placed on each side using fluoroscopic guidance over the remaining safety wire selectively.  Good proximal and distal plane were noted.  Tether was left in place and fashioned to the mons pubis and the  procedure was terminated.    The patient tolerated the procedure well.  No immediate perinatal complications.  The patient was taken to Post Anesthesia Care Unit in stable condition.  AN/NUANCE  D:08/05/2018 T:08/05/2018 JOB:[id number]

## 2018-08-05 NOTE — Brief Op Note (Signed)
08/05/2018  12:06 PM  PATIENT:  Kendra Morgan  22 y.o. female  PRE-OPERATIVE DIAGNOSIS:  BILATERAL URETERAL STONES  POST-OPERATIVE DIAGNOSIS:  BILATERAL URETERAL STONES  PROCEDURE:  Procedure(s): CYSTOSCOPY WITH RETROGRADE PYELOGRAM, URETEROSCOPY AND STENT EXCHANGE (Bilateral)  SURGEON:  Surgeon(s) and Role:    * Sebastian AcheManny, Saveah Bahar, MD - Primary  PHYSICIAN ASSISTANT:   ASSISTANTS: none   ANESTHESIA:   general  EBL:  0 mL   BLOOD ADMINISTERED:none  DRAINS: none   LOCAL MEDICATIONS USED:  NONE  SPECIMEN:  Source of Specimen:  bilateral ureteral / renal stone fragments  DISPOSITION OF SPECIMEN:  [facility] for compositional analysis  COUNTS:  YES  TOURNIQUET:  * No tourniquets in log *  DICTATION: .Other Dictation: Dictation Number [id number]  PLAN OF CARE: Discharge to home after PACU  PATIENT DISPOSITION:  PACU - hemodynamically stable.   Delay start of Pharmacological VTE agent (>24hrs) due to surgical blood loss or risk of bleeding: not applicable

## 2018-08-05 NOTE — Anesthesia Preprocedure Evaluation (Addendum)
Anesthesia Evaluation  Patient identified by MRN, date of birth, ID band Patient awake    Reviewed: Allergy & Precautions, NPO status , Patient's Chart, lab work & pertinent test results  Airway Mallampati: I  TM Distance: >3 FB Neck ROM: Full    Dental  (+) Teeth Intact, Dental Advisory Given   Pulmonary neg pulmonary ROS,    breath sounds clear to auscultation       Cardiovascular negative cardio ROS   Rhythm:Regular Rate:Normal     Neuro/Psych negative neurological ROS     GI/Hepatic Neg liver ROS, GERD  Medicated,  Endo/Other    Renal/GU Renal disease     Musculoskeletal negative musculoskeletal ROS (+)   Abdominal Normal abdominal exam  (+)   Peds  Hematology negative hematology ROS (+)   Anesthesia Other Findings   Reproductive/Obstetrics                            Anesthesia Physical Anesthesia Plan  ASA: II  Anesthesia Plan: General   Post-op Pain Management:    Induction: Intravenous  PONV Risk Score and Plan: 4 or greater and Ondansetron, Dexamethasone, Midazolam and Scopolamine patch - Pre-op  Airway Management Planned: LMA  Additional Equipment: None  Intra-op Plan:   Post-operative Plan: Extubation in OR  Informed Consent: I have reviewed the patients History and Physical, chart, labs and discussed the procedure including the risks, benefits and alternatives for the proposed anesthesia with the patient or authorized representative who has indicated his/her understanding and acceptance.   Dental advisory given  Plan Discussed with: CRNA  Anesthesia Plan Comments:        Anesthesia Quick Evaluation

## 2018-08-05 NOTE — Anesthesia Procedure Notes (Signed)
Procedure Name: LMA Insertion Date/Time: 08/05/2018 11:23 AM Performed by: Shelton Silvas, MD Pre-anesthesia Checklist: Patient identified, Emergency Drugs available, Suction available and Patient being monitored Patient Re-evaluated:Patient Re-evaluated prior to induction Oxygen Delivery Method: Circle system utilized Preoxygenation: Pre-oxygenation with 100% oxygen Induction Type: IV induction Ventilation: Mask ventilation without difficulty LMA: LMA inserted LMA Size: 3.0 Number of attempts: 1 Airway Equipment and Method: Bite block Placement Confirmation: positive ETCO2 Tube secured with: Tape Dental Injury: Teeth and Oropharynx as per pre-operative assessment

## 2018-08-05 NOTE — Discharge Instructions (Signed)
1 - You may have urinary urgency (bladder spasms) and bloody urine on / off with stent in place. This is normal.  2 -  Remove tethered stents on Friday morning at home by pulling on string, then blue-white plastic tubing and discarding. There are TWO stents. Office is open Friday if any problems arise.   3 - Call MD or go to ER for fever >102, severe pain / nausea / vomiting not relieved by medications, or acute change in medical status   Post Anesthesia Home Care Instructions  Activity: Get plenty of rest for the remainder of the day. A responsible individual must stay with you for 24 hours following the procedure.  For the next 24 hours, DO NOT: -Drive a car -Advertising copywriter -Drink alcoholic beverages -Take any medication unless instructed by your physician -Make any legal decisions or sign important papers.  Meals: Start with liquid foods such as gelatin or soup. Progress to regular foods as tolerated. Avoid greasy, spicy, heavy foods. If nausea and/or vomiting occur, drink only clear liquids until the nausea and/or vomiting subsides. Call your physician if vomiting continues.  Special Instructions/Symptoms: Your throat may feel dry or sore from the anesthesia or the breathing tube placed in your throat during surgery. If this causes discomfort, gargle with warm salt water. The discomfort should disappear within 24 hours.  If you had a scopolamine patch placed behind your ear for the management of post- operative nausea and/or vomiting:  1. The medication in the patch is effective for 72 hours, after which it should be removed.  Wrap patch in a tissue and discard in the trash. Wash hands thoroughly with soap and water. 2. You may remove the patch earlier than 72 hours if you experience unpleasant side effects which may include dry mouth, dizziness or visual disturbances. 3. Avoid touching the patch. Wash your hands with soap and water after contact with the patch.

## 2018-08-05 NOTE — Anesthesia Postprocedure Evaluation (Signed)
Anesthesia Post Note  Patient: MARYKATHRYN CARBONI  Procedure(s) Performed: CYSTOSCOPY WITH RETROGRADE PYELOGRAM, URETEROSCOPY , BASKET EXTRACTION OF STONES AND STENT EXCHANGE (Bilateral )     Patient location during evaluation: PACU Anesthesia Type: General Level of consciousness: awake and alert Pain management: pain level controlled Vital Signs Assessment: post-procedure vital signs reviewed and stable Respiratory status: spontaneous breathing, nonlabored ventilation, respiratory function stable and patient connected to nasal cannula oxygen Cardiovascular status: blood pressure returned to baseline and stable Postop Assessment: no apparent nausea or vomiting Anesthetic complications: no    Last Vitals:  Vitals:   08/05/18 1300 08/05/18 1315  BP: 111/70 104/63  Pulse: (!) 105 99  Resp: (!) 21 15  Temp:    SpO2: 100% 99%    Last Pain:  Vitals:   08/05/18 1315  TempSrc:   PainSc: Asleep                 Shelton Silvas

## 2018-08-05 NOTE — H&P (Signed)
Kendra Morgan is an 22 y.o. female.    Chief Complaint: Pre-op BILATERAL Ureteroscopic Stone Manipulation  HPI:   1 - Bilateral Ureteral Stones - bilateral <62mm UVJ stones with mild hydro by ER CT 07/2018 on eval flank pain. No addiitonal stones. Cr <1. S/p BILATERAL stenting 07/14/18 for temorizing.   2 - Urosepsis - fevers, tachycardia, leukocytosis to 30k, and baceruria on intak labs c/w urosepsis. Placed on rocephiin, BCX, UCX pan-sensitive e. Coli.   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 NO h/o refractory HTN or hypokalemia.    Today "Kendra Morgan" is seen to proceed with BILATERAL ureteroscopy to stone free. NO interval fevers.   Past Medical History:  Diagnosis Date  . Bilateral ureteral calculi   . GERD (gastroesophageal reflux disease)   . Urgency of urination   . Wears contact lenses     Past Surgical History:  Procedure Laterality Date  . CYSTOSCOPY W/ URETERAL STENT PLACEMENT Bilateral 07/14/2018   Procedure: CYSTOSCOPY WITH RETROGRADE PYELOGRAM/URETERAL STENT PLACEMENT;  Surgeon: Sebastian Ache, MD;  Location: [facility];  Service: Urology;  Laterality: Bilateral;  . WISDOM TOOTH EXTRACTION  2017    Family History  Problem Relation Age of Onset  . Urinary tract infection Mother    Social History:  reports that she has never smoked. She has never used smokeless tobacco. She reports that she does not drink alcohol or use drugs.  Allergies: No Known Allergies  No medications prior to admission.    No results found for this or any previous visit (from the past 48 hour(s)). No results found.  Review of Systems  Constitutional: Negative.  Negative for chills and fever.  HENT: Negative.   Eyes: Negative.   Respiratory: Negative.   Cardiovascular: Negative.   Gastrointestinal: Negative.   Genitourinary: Positive for frequency and urgency.  Musculoskeletal: Negative.   Skin: Negative.   Neurological: Negative.    Endo/Heme/Allergies: Negative.   Psychiatric/Behavioral: Negative.     Height 4' 11.5" (1.511 m), weight 52.6 kg, last menstrual period 07/26/2018. Physical Exam  Constitutional: She appears well-developed.  Eyes: Pupils are equal, round, and reactive to light.  Neck: Normal range of motion.  Cardiovascular: Normal rate.  Respiratory: Effort normal.  Genitourinary:  Genitourinary Comments: NO CVAT.   Musculoskeletal: Normal range of motion.  Neurological: She is alert.  Skin: Skin is warm.     Assessment/Plan  Proceed as planned with BILATERAL ureteroscopic stone manipulation with goal of stone free. Risks, benefits, alternatives, expected peri-op course discussed as well as need for at least temporary ureteral stents post-op since bilateral procedure.   Sebastian Ache, MD 08/05/2018, 8:16 AM

## 2018-08-05 NOTE — Transfer of Care (Signed)
Immediate Anesthesia Transfer of Care Note  Patient: Kendra Morgan  Procedure(s) Performed: Procedure(s) (LRB): CYSTOSCOPY WITH RETROGRADE PYELOGRAM, URETEROSCOPY , BASKET EXTRACTION OF STONES AND STENT EXCHANGE (Bilateral)  Patient Location: PACU  Anesthesia Type: General  Level of Consciousness: awake, sedated, patient cooperative and responds to stimulation  Airway & Oxygen Therapy: Patient Spontanous Breathing and Patient connected to [place] O2  Post-op Assessment: Report given to PACU RN, Post -op Vital signs reviewed and stable and Patient moving all extremities  Post vital signs: Reviewed and stable  Complications: No apparent anesthesia complications

## 2018-08-06 ENCOUNTER — Encounter (HOSPITAL_BASED_OUTPATIENT_CLINIC_OR_DEPARTMENT_OTHER): Payer: Self-pay | Admitting: Urology

## 2019-09-20 IMAGING — DX DG CHEST 1V PORT
1 series · 1 of 1 positions shown · non-contrast
Comparison: None.

CLINICAL DATA: Patient with fever.

EXAM:
PORTABLE CHEST 1 VIEW

[chest]
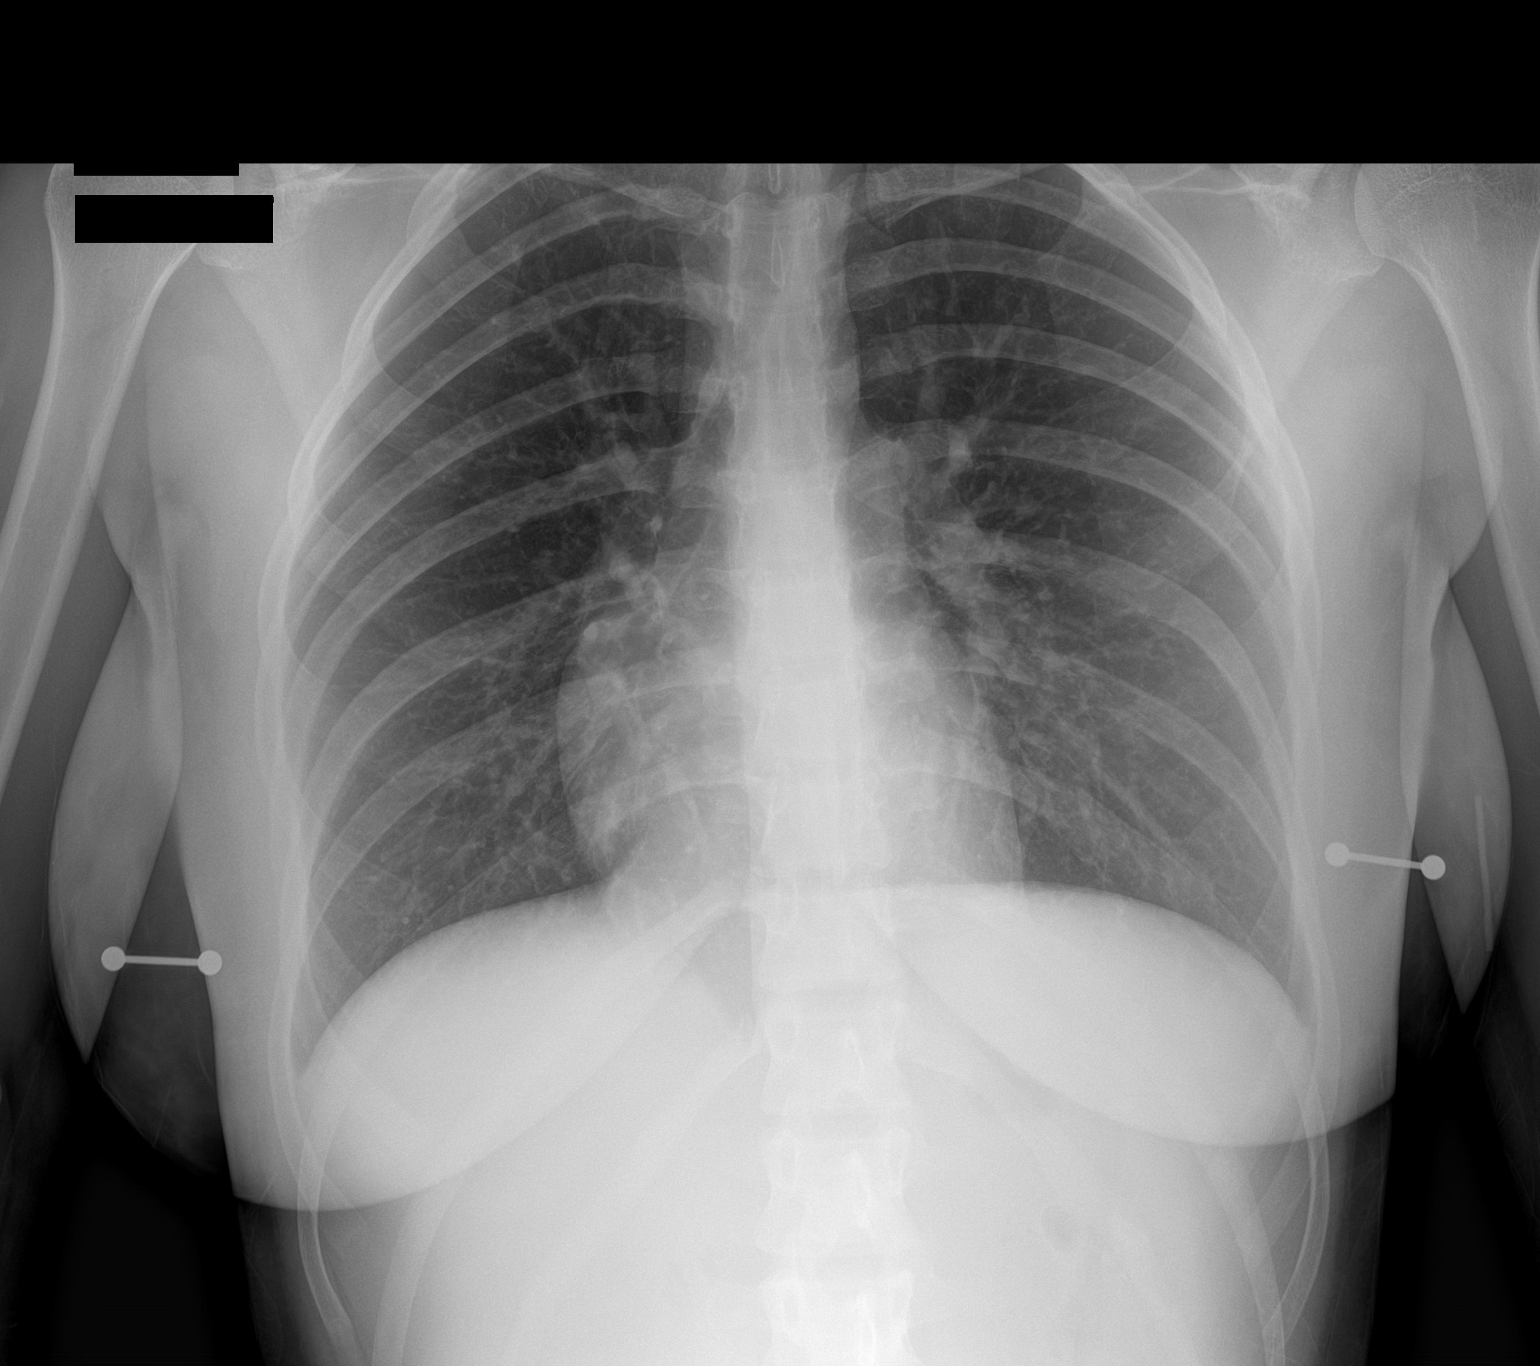

[1 of 1 positions shown; findings below may reference images not displayed]

FINDINGS: Normal cardiac and mediastinal contours. No consolidative pulmonary
opacities. No pleural effusion or pneumothorax.
IMPRESSION: No acute cardiopulmonary process.

## 2020-02-22 ENCOUNTER — Inpatient Hospital Stay (HOSPITAL_COMMUNITY)
Admission: EM | Admit: 2020-02-22 | Discharge: 2020-02-25 | DRG: 854 | Disposition: A | Discharge status: Home / Self Care | Payer: Self-pay | Attending: Family Medicine | Admitting: Family Medicine

## 2020-02-22 ENCOUNTER — Encounter (HOSPITAL_COMMUNITY): Payer: Self-pay | Admitting: *Deleted

## 2020-02-22 ENCOUNTER — Emergency Department (HOSPITAL_COMMUNITY): Payer: Self-pay

## 2020-02-22 ENCOUNTER — Other Ambulatory Visit: Payer: Self-pay

## 2020-02-22 DIAGNOSIS — N201 Calculus of ureter: Secondary | ICD-10-CM

## 2020-02-22 DIAGNOSIS — N1330 Unspecified hydronephrosis: Secondary | ICD-10-CM | POA: Diagnosis present

## 2020-02-22 DIAGNOSIS — N10 Acute tubulo-interstitial nephritis: Secondary | ICD-10-CM

## 2020-02-22 DIAGNOSIS — N390 Urinary tract infection, site not specified: Secondary | ICD-10-CM

## 2020-02-22 DIAGNOSIS — N136 Pyonephrosis: Secondary | ICD-10-CM | POA: Diagnosis present

## 2020-02-22 DIAGNOSIS — K219 Gastro-esophageal reflux disease without esophagitis: Secondary | ICD-10-CM | POA: Diagnosis present

## 2020-02-22 DIAGNOSIS — N200 Calculus of kidney: Secondary | ICD-10-CM

## 2020-02-22 DIAGNOSIS — A4151 Sepsis due to Escherichia coli [E. coli]: Principal | ICD-10-CM | POA: Diagnosis present

## 2020-02-22 DIAGNOSIS — Z20822 Contact with and (suspected) exposure to covid-19: Secondary | ICD-10-CM | POA: Diagnosis present

## 2020-02-22 DIAGNOSIS — A419 Sepsis, unspecified organism: Secondary | ICD-10-CM

## 2020-02-22 LAB — CBC
HCT: 37.8 % (ref 36.0–46.0)
Hemoglobin: 12.5 g/dL (ref 12.0–15.0)
MCH: 31.1 pg (ref 26.0–34.0)
MCHC: 33.1 g/dL (ref 30.0–36.0)
MCV: 94 fL (ref 80.0–100.0)
Platelets: 352 10*3/uL (ref 150–400)
RBC: 4.02 MIL/uL (ref 3.87–5.11)
RDW: 12.1 % (ref 11.5–15.5)
WBC: 19.1 10*3/uL — ABNORMAL HIGH (ref 4.0–10.5)
nRBC: 0 % (ref 0.0–0.2)

## 2020-02-22 LAB — URINALYSIS, ROUTINE W REFLEX MICROSCOPIC
Bilirubin Urine: NEGATIVE
Glucose, UA: NEGATIVE mg/dL
Hgb urine dipstick: NEGATIVE
Ketones, ur: NEGATIVE mg/dL
Nitrite: POSITIVE — AB
Protein, ur: 100 mg/dL — AB
RBC / HPF: >50 RBC/hpf — ABNORMAL HIGH (ref 0–5)
Specific Gravity, Urine: 1.023 (ref 1.005–1.030)
WBC, UA: >50 WBC/hpf — ABNORMAL HIGH (ref 0–5)
pH: 7 (ref 5.0–8.0)

## 2020-02-22 LAB — WET PREP, GENITAL
Clue Cells Wet Prep HPF POC: NONE SEEN
Sperm: NONE SEEN
Trich, Wet Prep: NONE SEEN
Yeast Wet Prep HPF POC: NONE SEEN

## 2020-02-22 LAB — LACTIC ACID, PLASMA
Lactic Acid, Venous: 1.9 mmol/L (ref 0.5–1.9)
Lactic Acid, Venous: 1 mmol/L (ref 0.5–1.9)

## 2020-02-22 LAB — I-STAT BETA HCG BLOOD, ED (MC, WL, AP ONLY): I-stat hCG, quantitative: <5 m[IU]/mL (ref <5)

## 2020-02-22 LAB — COMPREHENSIVE METABOLIC PANEL
ALT: 14 U/L (ref 0–44)
AST: 14 U/L — ABNORMAL LOW (ref 15–41)
Albumin: 4.1 g/dL (ref 3.5–5.0)
Alkaline Phosphatase: 73 U/L (ref 38–126)
Anion gap: 12 (ref 5–15)
BUN: 6 mg/dL (ref 6–20)
CO2: 26 mmol/L (ref 22–32)
Calcium: 9.1 mg/dL (ref 8.9–10.3)
Chloride: 102 mmol/L (ref 98–111)
Creatinine, Ser: 0.86 mg/dL (ref 0.44–1.00)
GFR calc Af Amer: >60 mL/min (ref >60)
GFR calc non Af Amer: >60 mL/min (ref >60)
Glucose, Bld: 112 mg/dL — ABNORMAL HIGH (ref 70–99)
Potassium: 3.4 mmol/L — ABNORMAL LOW (ref 3.5–5.1)
Sodium: 140 mmol/L (ref 135–145)
Total Bilirubin: 0.9 mg/dL (ref 0.3–1.2)
Total Protein: 7.4 g/dL (ref 6.5–8.1)

## 2020-02-22 MED ORDER — ONDANSETRON HCL 4 MG PO TABS: 4.0000 mg | ORAL_TABLET | Freq: Four times a day (QID) | ORAL | Status: DC | PRN

## 2020-02-22 MED ORDER — SODIUM CHLORIDE 0.9 % IV SOLN
1.0000 g | INTRAVENOUS | Status: DC
  Administered 2020-02-22 – 2020-02-24 (×3): 1 g via INTRAVENOUS
  Filled 2020-02-22: qty 10
  Filled 2020-02-22: qty 1
  Filled 2020-02-22 (×2): qty 10

## 2020-02-22 MED ORDER — ACETAMINOPHEN 500 MG PO TABS
1000.0000 mg | ORAL_TABLET | Freq: Once | ORAL | Status: AC
  Administered 2020-02-22: 22:00:00 1000 mg via ORAL
  Filled 2020-02-22: qty 2

## 2020-02-22 MED ORDER — PROPOFOL 10 MG/ML IV BOLUS
INTRAVENOUS | Status: AC
  Filled 2020-02-22: qty 20

## 2020-02-22 MED ORDER — LACTATED RINGERS IV SOLN: INTRAVENOUS | Status: DC

## 2020-02-22 MED ORDER — ENOXAPARIN SODIUM 40 MG/0.4ML ~~LOC~~ SOLN
40.0000 mg | SUBCUTANEOUS | Status: DC
  Filled 2020-02-22 (×2): qty 0.4

## 2020-02-22 MED ORDER — LACTATED RINGERS IV BOLUS
30.0000 mL/kg | Freq: Once | INTRAVENOUS | Status: AC
  Administered 2020-02-22: 21:00:00 1497 mL via INTRAVENOUS

## 2020-02-22 MED ORDER — ACETAMINOPHEN 325 MG PO TABS: 650.0000 mg | ORAL_TABLET | Freq: Four times a day (QID) | ORAL | Status: DC | PRN

## 2020-02-22 MED ORDER — ONDANSETRON HCL 4 MG/2ML IJ SOLN: 4.0000 mg | Freq: Four times a day (QID) | INTRAMUSCULAR | Status: DC | PRN

## 2020-02-22 MED ORDER — ONDANSETRON HCL 4 MG/2ML IJ SOLN
4.0000 mg | Freq: Once | INTRAMUSCULAR | Status: AC
  Administered 2020-02-22: 4 mg via INTRAVENOUS
  Filled 2020-02-22: qty 2

## 2020-02-22 MED ORDER — MIDAZOLAM HCL 2 MG/2ML IJ SOLN
INTRAMUSCULAR | Status: AC
  Filled 2020-02-22: qty 2

## 2020-02-22 MED ORDER — HYDROMORPHONE HCL 1 MG/ML IJ SOLN: 0.5000 mg | INTRAMUSCULAR | Status: DC | PRN

## 2020-02-22 MED ORDER — ONDANSETRON HCL 4 MG/2ML IJ SOLN
INTRAMUSCULAR | Status: AC
  Filled 2020-02-22: qty 2

## 2020-02-22 MED ORDER — ACETAMINOPHEN 650 MG RE SUPP: 650.0000 mg | Freq: Four times a day (QID) | RECTAL | Status: DC | PRN

## 2020-02-22 MED ORDER — DEXAMETHASONE SODIUM PHOSPHATE 10 MG/ML IJ SOLN
INTRAMUSCULAR | Status: AC
  Filled 2020-02-22: qty 1

## 2020-02-22 MED ORDER — FENTANYL CITRATE (PF) 250 MCG/5ML IJ SOLN
INTRAMUSCULAR | Status: AC
  Filled 2020-02-22: qty 5

## 2020-02-22 NOTE — ED Triage Notes (Signed)
Pt with on and off flank pain, fever, headache, decreased appetite for approx 1 week.

## 2020-02-22 NOTE — ED Notes (Signed)
Patient has a lav and light green in the main lab

## 2020-02-22 NOTE — ED Provider Notes (Signed)
[facility]-EMERGENCY DEPT Provider Note   CSN: [id number] Arrival date & time: 02/22/20  1610     History Chief Complaint  Patient presents with  . Flank Pain  . Fever    Kendra Morgan is a 24 y.o. female with a past medical history of renal stones, infected nephrolithiasis, pyelonephritis resulting in sepsis, who presents today for evaluation of approximately 1 week of back pain.  She reports that her pain is worse on the right side.  She reports she has been feeling warm and chilled.  She states that she does not yet feel as bad as she did last time when she was admitted for sepsis.  She denies any known Covid contacts.  She reports intact sense of smell, denies shortness of breath or chest pain.  She does report she has been nauseous however denies significant vomiting.  She reports foul urinary odors, urinary urgency and frequency.    She does report that she has had a change in her discharge and it is now more green than usual.    HPI     Past Medical History:  Diagnosis Date  . Bilateral ureteral calculi   . GERD (gastroesophageal reflux disease)   . Urgency of urination   . Wears contact lenses     Patient Active Problem List   Diagnosis Date Noted  . Nephrolithiasis 07/16/2018  . Allergic reaction 07/16/2018  . Severe sepsis (HCC) 07/16/2018  . Bilateral hydronephrosis 07/14/2018  . Leukocytosis 07/14/2018  . Pyelonephritis 07/13/2018    Past Surgical History:  Procedure Laterality Date  . CYSTOSCOPY W/ URETERAL STENT PLACEMENT Bilateral 07/14/2018   Procedure: CYSTOSCOPY WITH RETROGRADE PYELOGRAM/URETERAL STENT PLACEMENT;  Surgeon: Sebastian AcheManny, Theodore, MD;  Location: [facility];  Service: Urology;  Laterality: Bilateral;  . CYSTOSCOPY WITH RETROGRADE PYELOGRAM, URETEROSCOPY AND STENT PLACEMENT Bilateral 08/05/2018   Procedure: CYSTOSCOPY WITH RETROGRADE PYELOGRAM, URETEROSCOPY , BASKET EXTRACTION OF STONES AND STENT EXCHANGE;  Surgeon: Sebastian AcheManny,  Theodore, MD;  [facility] [place];  Service: Urology;  Laterality: Bilateral;  . WISDOM TOOTH EXTRACTION  2017     OB History   No obstetric history on file.     Family History  Problem Relation Age of Onset  . Urinary tract infection Mother     Social History   Tobacco Use  . Smoking status: Never Smoker  . Smokeless tobacco: Never Used  Substance Use Topics  . Alcohol use: Never  . Drug use: Never    Home Medications Prior to Admission medications   Medication Sig Start Date End Date Taking? Authorizing Provider  acetaminophen (TYLENOL) 500 MG tablet Take 1,000 mg by mouth every 6 (six) hours as needed for mild pain.   Yes [provider]  cephALEXin (KEFLEX) 500 MG capsule Take 1 capsule (500 mg total) by mouth 2 (two) times daily. X 3 days to prevent infection with stents in place Patient not taking: Reported on 02/22/2020 08/05/18   Sebastian AcheManny, Theodore, MD  diphenhydrAMINE (BENADRYL ALLERGY) 25 mg capsule Take 2 capsules (50 mg total) by mouth every 6 (six) hours as needed for allergies. Patient not taking: Reported on 02/22/2020 07/16/18   Calvert Cantorizwan, Saima, MD  EPINEPHrine (EPIPEN 2-PAK) 0.3 mg/0.3 mL IJ SOAJ injection Inject 0.3 mLs (0.3 mg total) into the muscle once for 1 dose. 07/16/18 07/31/18  Calvert Cantorizwan, Saima, MD  famotidine (PEPCID) 20 MG tablet Take 1 tablet (20 mg total) by mouth 2 (two) times daily as needed (allergic reaction). Patient not taking:  Reported on 02/22/2020 07/16/18 07/16/19  Calvert Cantor, MD  HYDROcodone-acetaminophen (NORCO/VICODIN) 5-325 MG tablet Take 1 tablet by mouth every 4 (four) hours as needed for severe pain. Post-operatively Patient not taking: Reported on 02/22/2020 08/05/18   Sebastian Ache, MD  ketorolac (TORADOL) 10 MG tablet Take 1 tablet (10 mg total) by mouth every 8 (eight) hours as needed. For mild pain / stent discomfort post-operatively Patient not taking: Reported on 02/22/2020 08/05/18   Sebastian Ache, MD  ondansetron  (ZOFRAN) 4 MG tablet Take 1 tablet (4 mg total) by mouth every 6 (six) hours as needed for nausea. Patient not taking: Reported on 02/22/2020 07/16/18   Calvert Cantor, MD  senna-docusate (SENOKOT-S) 8.6-50 MG tablet Take 1 tablet by mouth at bedtime as needed for mild constipation. Patient not taking: Reported on 02/22/2020 07/16/18   Calvert Cantor, MD    Allergies    Patient has no known allergies.  Review of Systems   Review of Systems  Constitutional: Positive for chills, fatigue and fever.  HENT: Negative for congestion.   Eyes: Negative for visual disturbance.  Respiratory: Negative for chest tightness and shortness of breath.   Gastrointestinal: Positive for abdominal pain and nausea. Negative for diarrhea and vomiting.  Genitourinary: Positive for dysuria, flank pain, frequency, urgency and vaginal discharge.  Musculoskeletal: Negative for back pain and neck pain.  Skin: Negative for color change and rash.  Neurological: Positive for headaches. Negative for weakness.  Psychiatric/Behavioral: Negative for confusion.  All other systems reviewed and are negative.   Physical Exam Updated Vital Signs BP 113/69   Pulse (!) 130   Temp (!) 102.9 F (39.4 C) (Oral)   Resp (!) 24   Ht 5' (1.524 m)   Wt 49.9 kg   LMP  (LMP Unknown)   SpO2 98%   BMI 21.48 kg/m   Physical Exam Vitals and nursing note reviewed. Exam conducted with a chaperone present.  Constitutional:      General: She is not in acute distress.    Appearance: She is well-developed. She is not diaphoretic.  HENT:     Head: Normocephalic and atraumatic.  Eyes:     General: No scleral icterus.       Right eye: No discharge.        Left eye: No discharge.     Conjunctiva/sclera: Conjunctivae normal.  Cardiovascular:     Rate and Rhythm: Normal rate and regular rhythm.  Pulmonary:     Effort: Pulmonary effort is normal. No respiratory distress.     Breath sounds: No stridor.  Abdominal:     General: There is no  distension.     Tenderness: There is no abdominal tenderness. There is no guarding.  Genitourinary:    Vagina: Normal.     Cervix: No cervical motion tenderness.     Adnexa:        Right: No mass, tenderness or fullness.         Left: No mass, tenderness or fullness.       Comments: Normal external female genitalia.   Strawberry red cervix There is green discharge in the vaginal canal.  Musculoskeletal:        General: No deformity.     Cervical back: Normal range of motion.  Skin:    General: Skin is warm and dry.  Neurological:     Mental Status: She is alert.     Motor: No abnormal muscle tone.  Psychiatric:        Mood  and Affect: Mood normal.        Behavior: Behavior normal.     ED Results / Procedures / Treatments   Labs (all labs ordered are listed, but only abnormal results are displayed) Labs Reviewed  WET PREP, GENITAL - Abnormal; Notable for the following components:      Result Value   WBC, Wet Prep HPF POC PRESENT (*)    All other components within normal limits  URINALYSIS, ROUTINE W REFLEX MICROSCOPIC - Abnormal; Notable for the following components:   Color, Urine AMBER (*)    APPearance CLOUDY (*)    Protein, ur 100 (*)    Nitrite POSITIVE (*)    Leukocytes,Ua LARGE (*)    RBC / HPF >50 (*)    WBC, UA >50 (*)    Bacteria, UA MANY (*)    All other components within normal limits  CBC - Abnormal; Notable for the following components:   WBC 19.1 (*)    All other components within normal limits  COMPREHENSIVE METABOLIC PANEL - Abnormal; Notable for the following components:   Potassium 3.4 (*)    Glucose, Bld 112 (*)    AST 14 (*)    All other components within normal limits  CULTURE, BLOOD (ROUTINE X 2)  CULTURE, BLOOD (ROUTINE X 2)  URINE CULTURE  RESPIRATORY PANEL BY RT PCR (FLU A&B, COVID)  LACTIC ACID, PLASMA  LACTIC ACID, PLASMA  APTT  PROTIME-INR  HIV ANTIBODY (ROUTINE TESTING W REFLEX)  RPR  I-STAT BETA HCG BLOOD, ED ([facility], [facility], [facility] ONLY)    GC/CHLAMYDIA PROBE AMP ([place]) NOT AT [facility]    EKG None  Radiology DG Chest Port 1 View  Result Date: 02/22/2020 CLINICAL DATA:  Sepsis fever EXAM: PORTABLE CHEST 1 VIEW COMPARISON:  07/13/2018 FINDINGS: The heart size and mediastinal contours are within normal limits. Both lungs are clear. The visualized skeletal structures are unremarkable. IMPRESSION: No active disease. Electronically Signed   By: Marlan Palau M.D.   On: 02/22/2020 21:18   CT Renal Stone Study  Result Date: 02/22/2020 CLINICAL DATA:  Right-sided abdominal pain with history of renal calculi EXAM: CT ABDOMEN AND PELVIS WITHOUT CONTRAST TECHNIQUE: Multidetector CT imaging of the abdomen and pelvis was performed following the standard protocol without IV contrast. COMPARISON:  07/14/2018 FINDINGS: Lower chest: No acute abnormality. Hepatobiliary: No focal liver abnormality is seen. No gallstones, gallbladder wall thickening, or biliary dilatation. Pancreas: Unremarkable. No pancreatic ductal dilatation or surrounding inflammatory changes. Spleen: Normal in size without focal abnormality. Adrenals/Urinary Tract: Adrenal glands are well visualized. Left adrenal adenoma is again seen and stable. The left kidney demonstrates a few small renal calculi. The largest of these measures 4 mm. This is slightly enlarged when compare with the prior examination. Hydronephrosis is noted on the right with hydroureter extending to a 9 mm obstructing stone in the right ureter. A second calculus is noted at the right UVJ which measures approximately 6 mm also consist right ureteral stone. Small nonobstructing lower pole renal stone is noted on the right as well. The bladder is decompressed. Stomach/Bowel: The appendix is not well visualized. No inflammatory changes are seen to suggest appendicitis. No small bowel or gastric abnormality is noted. Vascular/Lymphatic: No significant vascular findings are present. No enlarged abdominal or pelvic lymph  nodes. Reproductive: Uterus and bilateral adnexa are unremarkable. Other: No abdominal wall hernia or abnormality. No abdominopelvic ascites. Musculoskeletal: No acute or significant osseous findings. IMPRESSION: Two right ureteral stones as described with significant  hydronephrosis. Possibility of underlying pyelonephritis would deserve consideration as well although evaluation is limited due to lack of IV contrast. Bilateral nonobstructing stones increased when compared with the prior exam. Stable left adrenal adenoma. No other focal abnormality is seen. Electronically Signed   By: Inez Catalina M.D.   On: 02/22/2020 22:32    Procedures .Critical Care Performed by: Lorin Glass, PA-C Authorized by: Lorin Glass, PA-C   Critical care provider statement:    Critical care time (minutes):  45   Critical care was necessary to treat or prevent imminent or life-threatening deterioration of the following conditions:  Sepsis   Critical care was time spent personally by me on the following activities:  Discussions with consultants, evaluation of patient's response to treatment, examination of patient, ordering and performing treatments and interventions, ordering and review of laboratory studies, ordering and review of radiographic studies, pulse oximetry, re-evaluation of patient's condition, obtaining history from patient or surrogate and review of old charts   (including critical care time)  Medications Ordered in ED Medications  cefTRIAXone (ROCEPHIN) 1 g in sodium chloride 0.9 % 100 mL IVPB (1 g Intravenous New Bag/Given 02/22/20 2202)  ondansetron (ZOFRAN) injection 4 mg (has no administration in time range)  lactated ringers bolus 1,497 mL (1,497 mLs Intravenous New Bag/Given 02/22/20 2123)  acetaminophen (TYLENOL) tablet 1,000 mg (1,000 mg Oral Given 02/22/20 2202)    ED Course  I have reviewed the triage vital signs and the nursing notes.  Pertinent labs & imaging results that  were available during my care of the patient were reviewed by me and considered in my medical decision making (see chart for details).  Clinical Course as of Feb 21 2321  Tue Feb 22, 2020  2258 I spoke with Dr. Gloriann Loan who requests 2 hour covid, will take patient to [place].     [EH]  2259 Patient re-evaluated patient, she denies any new concerns.     [EH]    Clinical Course User Index [EH] Ollen Gross   MDM Rules/Calculators/A&P                     Patient is a 24 year old woman who presents today for evaluation of bilateral flank pain, right greater than left along with fevers and generally feeling unwell.  Here she is found to be septic.  She is tachycardic at 130, tachypneic at 24 with a fever of 102.9 and a white count of 19.1.   UA shows concern for infection with over 50 whites, however 50 reds, nitrite positive and many bacteria.  She was started on IV Rocephin in accordance with sepsis protocol, and given 30/kg fluid bolus.  Lactic acid is not significantly elevated.  She has a history of obstructing stones.  CT renal stone study was obtained showing 2 right sided obstructing ureteral stones. Given concern for pyelonephritis and a septic patient with multiple obstructing stones urology is consulted.  2-hour Covid test is ordered.  Dr. Gloriann Loan plans to take patient to the OR tonight.  RN is aware of need for two hour covid test.   Medicine is consulted, I spoke with Dr. Alcario Drought Who will see patient for admission.  Note: Portions of this report may have been transcribed using voice recognition software. Every effort was made to ensure accuracy; however, inadvertent computerized transcription errors may be present   Final Clinical Impression(s) / ED Diagnoses Final diagnoses:  Sepsis, due to unspecified organism, unspecified whether acute organ dysfunction  present [facility])  Ureteral stone  Pyelonephritis, acute    Rx / DC Orders ED Discharge Orders    None         Norman Clay 02/22/20 2358    Geoffery Lyons, MD 02/25/20 [phone]

## 2020-02-22 NOTE — ED Notes (Signed)
Urine culture sent with UA 

## 2020-02-22 NOTE — H&P (Signed)
History and Physical    [name] FYB:[id number] DOB: 1996/05/14 DOA: 02/22/2020  PCP: Patient, No Pcp Per  Patient coming from: Home  I have personally briefly reviewed patient's old medical records in [facility]  Chief Complaint: Fever, flank pain  HPI: Kendra Morgan is a 24 y.o. female with medical history significant of ureteral calculi, UTIs, and prior pyohydronephrosis.  Pt presents to ED with c/o flank pain, rever, headache, decreased appetite for about 1 week.  Pain worse on R side.  No COVID contacts, sense of smell is intact, no SOB. No CP.   ED Course: UA is grossly infected, Tm 102.9, WBC 19.1k, HR 130.  CXR neg  CT abd/pelvis shows 2 R ureteral stones: 1 is 72mm and the other is 33mm.  Confirms obstruction and R hydronephrosis and hydroureter.  Given rocephin, given 1.5L LR bolus for sepsis.  Dr. Alvester Morin consulted.   Review of Systems: As per HPI, otherwise all review of systems negative.  Past Medical History:  Diagnosis Date  . Bilateral ureteral calculi   . GERD (gastroesophageal reflux disease)   . Urgency of urination   . Wears contact lenses     Past Surgical History:  Procedure Laterality Date  . CYSTOSCOPY W/ URETERAL STENT PLACEMENT Bilateral 07/14/2018   Procedure: CYSTOSCOPY WITH RETROGRADE PYELOGRAM/URETERAL STENT PLACEMENT;  Surgeon: Sebastian Ache, MD;  Location: [facility];  Service: Urology;  Laterality: Bilateral;  . CYSTOSCOPY WITH RETROGRADE PYELOGRAM, URETEROSCOPY AND STENT PLACEMENT Bilateral 08/05/2018   Procedure: CYSTOSCOPY WITH RETROGRADE PYELOGRAM, URETEROSCOPY , BASKET EXTRACTION OF STONES AND STENT EXCHANGE;  Surgeon: Sebastian Ache, MD;  Location: [facility];  Service: Urology;  Laterality: Bilateral;  . WISDOM TOOTH EXTRACTION  2017     reports that she has never smoked. She has never used smokeless tobacco. She reports that she does not drink alcohol or use drugs.  No Known Allergies  Family History    Problem Relation Age of Onset  . Urinary tract infection Mother      Prior to Admission medications   Medication Sig Start Date End Date Taking? Authorizing Provider  acetaminophen (TYLENOL) 500 MG tablet Take 1,000 mg by mouth every 6 (six) hours as needed for mild pain.   Yes [provider]  cephALEXin (KEFLEX) 500 MG capsule Take 1 capsule (500 mg total) by mouth 2 (two) times daily. X 3 days to prevent infection with stents in place Patient not taking: Reported on 02/22/2020 08/05/18   Sebastian Ache, MD  diphenhydrAMINE (BENADRYL ALLERGY) 25 mg capsule Take 2 capsules (50 mg total) by mouth every 6 (six) hours as needed for allergies. Patient not taking: Reported on 02/22/2020 07/16/18   Calvert Cantor, MD  EPINEPHrine (EPIPEN 2-PAK) 0.3 mg/0.3 mL IJ SOAJ injection Inject 0.3 mLs (0.3 mg total) into the muscle once for 1 dose. 07/16/18 07/31/18  Calvert Cantor, MD  famotidine (PEPCID) 20 MG tablet Take 1 tablet (20 mg total) by mouth 2 (two) times daily as needed (allergic reaction). Patient not taking: Reported on 02/22/2020 07/16/18 07/16/19  Calvert Cantor, MD  HYDROcodone-acetaminophen (NORCO/VICODIN) 5-325 MG tablet Take 1 tablet by mouth every 4 (four) hours as needed for severe pain. Post-operatively Patient not taking: Reported on 02/22/2020 08/05/18   Sebastian Ache, MD  ketorolac (TORADOL) 10 MG tablet Take 1 tablet (10 mg total) by mouth every 8 (eight) hours as needed. For mild pain / stent discomfort post-operatively Patient not taking: Reported on 02/22/2020 08/05/18   Sebastian Ache,  MD  ondansetron (ZOFRAN) 4 MG tablet Take 1 tablet (4 mg total) by mouth every 6 (six) hours as needed for nausea. Patient not taking: Reported on 02/22/2020 07/16/18   Calvert Cantor, MD  senna-docusate (SENOKOT-S) 8.6-50 MG tablet Take 1 tablet by mouth at bedtime as needed for mild constipation. Patient not taking: Reported on 02/22/2020 07/16/18   Calvert Cantor, MD    Physical Exam: Vitals:    02/22/20 2042 02/22/20 2100 02/22/20 2200 02/22/20 2215  BP: 105/67 107/60 118/76 113/69  Pulse: (!) 111 (!) 110  (!) 130  Resp: 15   (!) 24  Temp:      TempSrc:      SpO2: 98% 98%    Weight:      Height:        Constitutional: NAD, calm, comfortable Eyes: PERRL, lids and conjunctivae normal ENMT: Mucous membranes are moist. Posterior pharynx clear of any exudate or lesions.Normal dentition.  Neck: normal, supple, no masses, no thyromegaly Respiratory: clear to auscultation bilaterally, no wheezing, no crackles. Normal respiratory effort. No accessory muscle use.  Cardiovascular: Regular rate and rhythm, no murmurs / rubs / gallops. No extremity edema. 2+ pedal pulses. No carotid bruits.  Abdomen: no tenderness, no masses palpated. No hepatosplenomegaly. Bowel sounds positive.  Musculoskeletal: no clubbing / cyanosis. No joint deformity upper and lower extremities. Good ROM, no contractures. Normal muscle tone.  Skin: no rashes, lesions, ulcers. No induration Neurologic: CN 2-12 grossly intact. Sensation intact, DTR normal. Strength 5/5 in all 4.  Psychiatric: Normal judgment and insight. Alert and oriented x 3. Normal mood.    Labs on Admission: I have personally reviewed following labs and imaging studies  CBC: Recent Labs  Lab 02/22/20 1701  WBC 19.1*  HGB 12.5  HCT 37.8  MCV 94.0  PLT 352   Basic Metabolic Panel: Recent Labs  Lab 02/22/20 1701  NA 140  K 3.4*  CL 102  CO2 26  GLUCOSE 112*  BUN 6  CREATININE 0.86  CALCIUM 9.1   GFR: Estimated Creatinine Clearance: 73.7 mL/min (by C-G formula based on SCr of 0.86 mg/dL). Liver Function Tests: Recent Labs  Lab 02/22/20 1701  AST 14*  ALT 14  ALKPHOS 73  BILITOT 0.9  PROT 7.4  ALBUMIN 4.1   No results for input(s): LIPASE, AMYLASE in the last 168 hours. No results for input(s): AMMONIA in the last 168 hours. Coagulation Profile: No results for input(s): INR, PROTIME in the last 168 hours. Cardiac  Enzymes: No results for input(s): CKTOTAL, CKMB, CKMBINDEX, TROPONINI in the last 168 hours. BNP (last 3 results) No results for input(s): PROBNP in the last 8760 hours. HbA1C: No results for input(s): HGBA1C in the last 72 hours. CBG: No results for input(s): GLUCAP in the last 168 hours. Lipid Profile: No results for input(s): CHOL, HDL, LDLCALC, TRIG, CHOLHDL, LDLDIRECT in the last 72 hours. Thyroid Function Tests: No results for input(s): TSH, T4TOTAL, FREET4, T3FREE, THYROIDAB in the last 72 hours. Anemia Panel: No results for input(s): VITAMINB12, FOLATE, FERRITIN, TIBC, IRON, RETICCTPCT in the last 72 hours. Urine analysis:    Component Value Date/Time   COLORURINE AMBER (A) 02/22/2020 1736   APPEARANCEUR CLOUDY (A) 02/22/2020 1736   LABSPEC 1.023 02/22/2020 1736   PHURINE 7.0 02/22/2020 1736   GLUCOSEU NEGATIVE 02/22/2020 1736   HGBUR NEGATIVE 02/22/2020 1736   BILIRUBINUR NEGATIVE 02/22/2020 1736   KETONESUR NEGATIVE 02/22/2020 1736   PROTEINUR 100 (A) 02/22/2020 1736   UROBILINOGEN 0.2 04/12/2011 1441  NITRITE POSITIVE (A) 02/22/2020 1736   LEUKOCYTESUR LARGE (A) 02/22/2020 1736    Radiological Exams on Admission: DG Chest Port 1 View  Result Date: 02/22/2020 CLINICAL DATA:  Sepsis fever EXAM: PORTABLE CHEST 1 VIEW COMPARISON:  07/13/2018 FINDINGS: The heart size and mediastinal contours are within normal limits. Both lungs are clear. The visualized skeletal structures are unremarkable. IMPRESSION: No active disease. Electronically Signed   By: Marlan Palau M.D.   On: 02/22/2020 21:18   CT Renal Stone Study  Result Date: 02/22/2020 CLINICAL DATA:  Right-sided abdominal pain with history of renal calculi EXAM: CT ABDOMEN AND PELVIS WITHOUT CONTRAST TECHNIQUE: Multidetector CT imaging of the abdomen and pelvis was performed following the standard protocol without IV contrast. COMPARISON:  07/14/2018 FINDINGS: Lower chest: No acute abnormality. Hepatobiliary: No focal  liver abnormality is seen. No gallstones, gallbladder wall thickening, or biliary dilatation. Pancreas: Unremarkable. No pancreatic ductal dilatation or surrounding inflammatory changes. Spleen: Normal in size without focal abnormality. Adrenals/Urinary Tract: Adrenal glands are well visualized. Left adrenal adenoma is again seen and stable. The left kidney demonstrates a few small renal calculi. The largest of these measures 4 mm. This is slightly enlarged when compare with the prior examination. Hydronephrosis is noted on the right with hydroureter extending to a 9 mm obstructing stone in the right ureter. A second calculus is noted at the right UVJ which measures approximately 6 mm also consist right ureteral stone. Small nonobstructing lower pole renal stone is noted on the right as well. The bladder is decompressed. Stomach/Bowel: The appendix is not well visualized. No inflammatory changes are seen to suggest appendicitis. No small bowel or gastric abnormality is noted. Vascular/Lymphatic: No significant vascular findings are present. No enlarged abdominal or pelvic lymph nodes. Reproductive: Uterus and bilateral adnexa are unremarkable. Other: No abdominal wall hernia or abnormality. No abdominopelvic ascites. Musculoskeletal: No acute or significant osseous findings. IMPRESSION: Two right ureteral stones as described with significant hydronephrosis. Possibility of underlying pyelonephritis would deserve consideration as well although evaluation is limited due to lack of IV contrast. Bilateral nonobstructing stones increased when compared with the prior exam. Stable left adrenal adenoma. No other focal abnormality is seen. Electronically Signed   By: Alcide Clever M.D.   On: 02/22/2020 22:32    EKG: Independently reviewed.  Assessment/Plan Principal Problem:   Pyohydronephrosis Active Problems:   Sepsis secondary to UTI (HCC)   Nephrolithiasis   Hydronephrosis of right kidney     1. Pyohydronephrosis and sepsis - 1. Rocephin 2. Culture pending 3. IVF: 1.5L bolus and LR at 125 cc/hr 4. Tele monitor 5. Dr. Alvester Morin notified, plan is for pt to go to OR tonight urgently. 6. NPO till after OR  DVT prophylaxis: Lovenox to start tomorrow AM Code Status: Full Family Communication: No family in room Disposition Plan: Home after sepsis and pyohydronephrosis treated Consults called: Dr. Alvester Morin Admission status: Admit to inpatient  Severity of Illness: The appropriate patient status for this patient is INPATIENT. Inpatient status is judged to be reasonable and necessary in order to provide the required intensity of service to ensure the patient's safety. The patient's presenting symptoms, physical exam findings, and initial radiographic and laboratory data in the context of their chronic comorbidities is felt to place them at high risk for further clinical deterioration. Furthermore, it is not anticipated that the patient will be medically stable for discharge from the hospital within 2 midnights of admission. The following factors support the patient status of inpatient.   IP  status due to sepsis associated with pyohydronephrosis requiring emergent stenting and decompression.   * I certify that at the point of admission it is my clinical judgment that the patient will require inpatient hospital care spanning beyond 2 midnights from the point of admission due to high intensity of service, high risk for further deterioration and high frequency of surveillance required.*    Leray Garverick M. DO [facility]  How to contact the [facility] Attending or Consulting provider [place] or covering provider during after hours [place], for this patient?  1. Check the care team in [facility] and look for a) attending/consulting [facility] provider listed and b) the [facility] team listed 2. Log into [email or web address]  Amion Physician Scheduling and messaging for groups and whole hospitals  On call and physician  scheduling software for group practices, residents, hospitalists and other medical providers for call, clinic, rotation and shift schedules. OnCall [employer] is a hospital-wide system for scheduling doctors and paging doctors on call. EasyPlot is for scientific plotting and data analysis.  [email or web address]  and use [place]'s universal password to access. If you do not have the password, please contact the hospital operator.  3. Locate the [facility] provider you are looking for under [facility] and page to a number that you can be directly reached. 4. If you still have difficulty reaching the provider, please page the [facility] (Director on Call) for the Hospitalists listed on amion for assistance.  02/22/2020, 11:42 PM

## 2020-02-23 ENCOUNTER — Encounter (HOSPITAL_COMMUNITY)
Admission: EM | Disposition: A | Discharge status: Home / Self Care | Payer: Self-pay | Source: Home / Self Care | Attending: Family Medicine

## 2020-02-23 ENCOUNTER — Inpatient Hospital Stay (HOSPITAL_COMMUNITY): Payer: Self-pay | Admitting: Registered Nurse

## 2020-02-23 ENCOUNTER — Inpatient Hospital Stay (HOSPITAL_COMMUNITY): Payer: Self-pay

## 2020-02-23 DIAGNOSIS — N390 Urinary tract infection, site not specified: Secondary | ICD-10-CM | POA: Diagnosis present

## 2020-02-23 DIAGNOSIS — N200 Calculus of kidney: Secondary | ICD-10-CM

## 2020-02-23 HISTORY — PX: CYSTOSCOPY W/ URETERAL STENT PLACEMENT: SHX1429

## 2020-02-23 LAB — BASIC METABOLIC PANEL
Anion gap: 9 (ref 5–15)
BUN: 6 mg/dL (ref 6–20)
CO2: 28 mmol/L (ref 22–32)
Calcium: 8.9 mg/dL (ref 8.9–10.3)
Chloride: 103 mmol/L (ref 98–111)
Creatinine, Ser: 0.69 mg/dL (ref 0.44–1.00)
GFR calc Af Amer: >60 mL/min (ref >60)
GFR calc non Af Amer: >60 mL/min (ref >60)
Glucose, Bld: 125 mg/dL — ABNORMAL HIGH (ref 70–99)
Potassium: 4 mmol/L (ref 3.5–5.1)
Sodium: 140 mmol/L (ref 135–145)

## 2020-02-23 LAB — CBC
HCT: 37.5 % (ref 36.0–46.0)
Hemoglobin: 12.2 g/dL (ref 12.0–15.0)
MCH: 30.2 pg (ref 26.0–34.0)
MCHC: 32.5 g/dL (ref 30.0–36.0)
MCV: 92.8 fL (ref 80.0–100.0)
Platelets: 273 10*3/uL (ref 150–400)
RBC: 4.04 MIL/uL (ref 3.87–5.11)
RDW: 12.2 % (ref 11.5–15.5)
WBC: 18.7 10*3/uL — ABNORMAL HIGH (ref 4.0–10.5)
nRBC: 0 % (ref 0.0–0.2)

## 2020-02-23 LAB — RESPIRATORY PANEL BY RT PCR (FLU A&B, COVID)
Influenza A by PCR: NEGATIVE
Influenza B by PCR: NEGATIVE
SARS Coronavirus 2 by RT PCR: NEGATIVE

## 2020-02-23 LAB — PROTIME-INR
INR: 1.3 — ABNORMAL HIGH (ref 0.8–1.2)
Prothrombin Time: 16.4 seconds — ABNORMAL HIGH (ref 11.4–15.2)

## 2020-02-23 LAB — APTT: aPTT: 34 seconds (ref 24–36)

## 2020-02-23 LAB — HIV ANTIBODY (ROUTINE TESTING W REFLEX): HIV Screen 4th Generation wRfx: NONREACTIVE

## 2020-02-23 LAB — SYPHILIS: RPR W/REFLEX TO RPR TITER AND TREPONEMAL ANTIBODIES, TRADITIONAL SCREENING AND DIAGNOSIS ALGORITHM: RPR Ser Ql: NONREACTIVE

## 2020-02-23 SURGERY — CYSTOSCOPY, WITH RETROGRADE PYELOGRAM AND URETERAL STENT INSERTION
Anesthesia: General | Site: Ureter | Laterality: Right

## 2020-02-23 MED ORDER — FENTANYL CITRATE (PF) 100 MCG/2ML IJ SOLN: 25.0000 ug | INTRAMUSCULAR | Status: DC | PRN

## 2020-02-23 MED ORDER — LIDOCAINE 2% (20 MG/ML) 5 ML SYRINGE
INTRAMUSCULAR | Status: DC | PRN
  Administered 2020-02-23: 50 mg via INTRAVENOUS

## 2020-02-23 MED ORDER — PROMETHAZINE HCL 25 MG/ML IJ SOLN: 6.2500 mg | INTRAMUSCULAR | Status: DC | PRN

## 2020-02-23 MED ORDER — SODIUM CHLORIDE 0.9 % IR SOLN
Status: DC | PRN
  Administered 2020-02-23: 3000 mL

## 2020-02-23 MED ORDER — IOHEXOL 300 MG/ML  SOLN
INTRAMUSCULAR | Status: DC | PRN
  Administered 2020-02-23: 9 mL via URETHRAL

## 2020-02-23 MED ORDER — SUCCINYLCHOLINE CHLORIDE 200 MG/10ML IV SOSY
PREFILLED_SYRINGE | INTRAVENOUS | Status: DC | PRN
  Administered 2020-02-23: 60 mg via INTRAVENOUS

## 2020-02-23 MED ORDER — FENTANYL CITRATE (PF) 100 MCG/2ML IJ SOLN
INTRAMUSCULAR | Status: DC | PRN
  Administered 2020-02-23 (×2): 25 ug via INTRAVENOUS

## 2020-02-23 MED ORDER — ACETAMINOPHEN 10 MG/ML IV SOLN
INTRAVENOUS | Status: AC
  Filled 2020-02-23: qty 100

## 2020-02-23 MED ORDER — ONDANSETRON HCL 4 MG/2ML IJ SOLN
INTRAMUSCULAR | Status: DC | PRN
  Administered 2020-02-23: 4 mg via INTRAVENOUS

## 2020-02-23 MED ORDER — DEXAMETHASONE SODIUM PHOSPHATE 10 MG/ML IJ SOLN
INTRAMUSCULAR | Status: DC | PRN
  Administered 2020-02-23: 10 mg via INTRAVENOUS

## 2020-02-23 MED ORDER — PROPOFOL 10 MG/ML IV BOLUS
INTRAVENOUS | Status: DC | PRN
  Administered 2020-02-23: 100 mg via INTRAVENOUS

## 2020-02-23 MED ORDER — 0.9 % SODIUM CHLORIDE (POUR BTL) OPTIME
TOPICAL | Status: DC | PRN
  Administered 2020-02-23: 02:00:00 1000 mL

## 2020-02-23 MED ORDER — MIDAZOLAM HCL 5 MG/5ML IJ SOLN
INTRAMUSCULAR | Status: DC | PRN
  Administered 2020-02-23: 1 mg via INTRAVENOUS

## 2020-02-23 MED ORDER — SODIUM CHLORIDE (PF) 0.9 % IJ SOLN
INTRAMUSCULAR | Status: AC
  Filled 2020-02-23: qty 20

## 2020-02-23 SURGICAL SUPPLY — 17 items
BAG URO CATCHER STRL LF (MISCELLANEOUS) ×3 IMPLANT
CATH INTERMIT  6FR 70CM (CATHETERS) ×3 IMPLANT
CLOTH BEACON ORANGE TIMEOUT ST (SAFETY) ×3 IMPLANT
GLOVE BIO SURGEON STRL SZ7.5 (GLOVE) ×3 IMPLANT
GLOVE BIOGEL PI IND STRL 7.5 (GLOVE) ×2 IMPLANT
GLOVE BIOGEL PI IND STRL 8 (GLOVE) ×2 IMPLANT
GLOVE BIOGEL PI INDICATOR 7.5 (GLOVE) ×1
GLOVE BIOGEL PI INDICATOR 8 (GLOVE) ×1
GLOVE ECLIPSE 8.0 STRL XLNG CF (GLOVE) ×3 IMPLANT
GOWN STRL REUS W/TWL XL LVL3 (GOWN DISPOSABLE) ×6 IMPLANT
GUIDEWIRE STR DUAL SENSOR (WIRE) ×3 IMPLANT
KIT TURNOVER KIT A (KITS) ×3 IMPLANT
MANIFOLD NEPTUNE II (INSTRUMENTS) ×3 IMPLANT
PACK CYSTO (CUSTOM PROCEDURE TRAY) ×3 IMPLANT
STENT URET 6FRX24 CONTOUR (STENTS) ×3 IMPLANT
TUBING CONNECTING 10 (TUBING) ×3 IMPLANT
TUBING UROLOGY SET (TUBING) IMPLANT

## 2020-02-23 NOTE — Op Note (Signed)
Operative Note  Preoperative diagnosis:  1.  Right ureteral calculus 2.  Sepsis secondary to urinary tract infection  Post operative diagnosis: 1.  Right ureteral calculus 2.  Sepsis secondary to urinary tract infection  Procedure(s): 1.  Cystoscopy with right retrograde pyelogram and right ureteral stent placement  Surgeon: Modena Slater, MD  Assistants: None  Anesthesia: General  Complications: None immediate  EBL: Minimal  Specimens: 1.  None  Drains/Catheters: 1.  6 X 24 double-J ureteral stent  Intraoperative findings: 1.  Normal urethra and bladder 2.  Right retrograde pyelogram revealed a filling defect at the level of the stone with upstream hydroureteronephrosis  Indication: 24 year old female with obstructing right ureteral calculus and signs of sepsis secondary to UTI presents for urgent ureteral stent placement.  Description of procedure:  The patient was identified and consent was obtained.  The patient was taken to the operating room and placed in the supine position.  The patient was placed under general anesthesia.  Perioperative antibiotics were administered.  The patient was placed in dorsal lithotomy.  Patient was prepped and draped in a standard sterile fashion and a timeout was performed.  A 21 French rigid cystoscope was advanced into the urethra and into the bladder.  The right distal most portion of the ureter was cannulated with an open-ended ureteral catheter.  Retrograde pyelogram was performed with the findings noted above.  A sensor wire was then advanced up to the kidney under fluoroscopic guidance.  A 6 X 24 double-J ureteral stent was advanced up to the kidney under fluoroscopic guidance.  The wire was withdrawn and fluoroscopy confirmed good proximal placement and direct visualization confirmed a good coil within the bladder.  The bladder was drained and the scope withdrawn.  This concluded the operation.  Patient tolerated procedure well and was  stable postoperatively.  Plan: Continue antibiotics until cultures return.  She will eventually need outpatient ureteroscopy.

## 2020-02-23 NOTE — Anesthesia Procedure Notes (Signed)
Procedure Name: Intubation Date/Time: 02/23/2020 1:45 AM Performed by: Lissa Morales, CRNA Pre-anesthesia Checklist: Patient identified, Emergency Drugs available, Suction available and Patient being monitored Patient Re-evaluated:Patient Re-evaluated prior to induction Oxygen Delivery Method: Circle system utilized Preoxygenation: Pre-oxygenation with 100% oxygen Induction Type: IV induction, Cricoid Pressure applied and Rapid sequence Ventilation: Mask ventilation without difficulty Laryngoscope Size: Mac and 3 Grade View: Grade II Tube type: Oral Tube size: 7.0 mm Number of attempts: 1 Airway Equipment and Method: Stylet and Oral airway Placement Confirmation: ETT inserted through vocal cords under direct vision,  positive ETCO2 and breath sounds checked- equal and bilateral Secured at: 21 cm Tube secured with: Tape Dental Injury: Teeth and Oropharynx as per pre-operative assessment

## 2020-02-23 NOTE — Consult Note (Signed)
H&P Physician requesting consult: Lyda Perone  Chief Complaint: Right ureteral stone, sepsis  History of Present Illness: 24 year old female with a history of nephrolithiasis.  She has had a history of urosepsis secondary to obstructing calculi in the past.  She has previously been taken care of by Dr. Berneice Heinrich in our practice.  She last underwent ureteroscopy on 08/05/2018.  For the past week, she has had progressive right-sided flank pain.  Today, her pain worsened and she also developed a fever and chills.  She presented to the emergency department with fever of 103, tachycardia, hypotension.  CT scan was performed that revealed an obstructing 9 mm calculus as well as a second 6 mm calculus at the right UVJ.  She had some small left renal calculi but no left ureteral calculi.  Upon assessment, tachycardia had improved a small amount into the low 90s.  Her chills and rigors had improved.  Covid test pending.  Past Medical History:  Diagnosis Date  . Bilateral ureteral calculi   . GERD (gastroesophageal reflux disease)   . Urgency of urination   . Wears contact lenses    Past Surgical History:  Procedure Laterality Date  . CYSTOSCOPY W/ URETERAL STENT PLACEMENT Bilateral 07/14/2018   Procedure: CYSTOSCOPY WITH RETROGRADE PYELOGRAM/URETERAL STENT PLACEMENT;  Surgeon: Sebastian Ache, MD;  Location: [facility];  Service: Urology;  Laterality: Bilateral;  . CYSTOSCOPY WITH RETROGRADE PYELOGRAM, URETEROSCOPY AND STENT PLACEMENT Bilateral 08/05/2018   Procedure: CYSTOSCOPY WITH RETROGRADE PYELOGRAM, URETEROSCOPY , BASKET EXTRACTION OF STONES AND STENT EXCHANGE;  Surgeon: Sebastian Ache, MD;  Location: [facility];  Service: Urology;  Laterality: Bilateral;  . WISDOM TOOTH EXTRACTION  2017    Home Medications:  (Not in a hospital admission)  Allergies: No Known Allergies  Family History  Problem Relation Age of Onset  . Urinary tract infection Mother    Social History:  reports that  she has never smoked. She has never used smokeless tobacco. She reports that she does not drink alcohol or use drugs.  ROS: A complete review of systems was performed.  All systems are negative except for pertinent findings as noted. ROS   Physical Exam:  Vital signs in last 24 hours: Temp:  [99.5 F (37.5 C)-102.9 F (39.4 C)] 102.9 F (39.4 C) (04/13 2039) Pulse Rate:  [94-130] 94 (04/14 0000) Resp:  [15-31] 31 (04/14 0000) BP: (93-118)/(58-76) 100/58 (04/14 0000) SpO2:  [96 %-99 %] 97 % (04/14 0000) Weight:  [49.9 kg] 49.9 kg (04/13 1631) General:  Alert and oriented, No acute distress HEENT: Normocephalic, atraumatic Neck: No JVD or lymphadenopathy Cardiovascular: Regular rate and rhythm Lungs: Regular rate and effort Abdomen: Soft, nontender, nondistended, no abdominal masses Back: No CVA tenderness Extremities: No edema Neurologic: Grossly intact  Laboratory Data:  Results for orders placed or performed during the hospital encounter of 02/22/20 (from the past 24 hour(s))  CBC     Status: Abnormal   Collection Time: 02/22/20  5:01 PM  Result Value Ref Range   WBC 19.1 (H) 4.0 - 10.5 K/uL   RBC 4.02 3.87 - 5.11 MIL/uL   Hemoglobin 12.5 12.0 - 15.0 g/dL   HCT 27.2 53.6 - 64.4 %   MCV 94.0 80.0 - 100.0 fL   MCH 31.1 26.0 - 34.0 pg   MCHC 33.1 30.0 - 36.0 g/dL   RDW 03.4 74.2 - 59.5 %   Platelets 352 150 - 400 K/uL   nRBC 0.0 0.0 - 0.2 %  Comprehensive metabolic panel  Status: Abnormal   Collection Time: 02/22/20  5:01 PM  Result Value Ref Range   Sodium 140 135 - 145 mmol/L   Potassium 3.4 (L) 3.5 - 5.1 mmol/L   Chloride 102 98 - 111 mmol/L   CO2 26 22 - 32 mmol/L   Glucose, Bld 112 (H) 70 - 99 mg/dL   BUN 6 6 - 20 mg/dL   Creatinine, Ser 0.86 0.44 - 1.00 mg/dL   Calcium 9.1 8.9 - 10.3 mg/dL   Total Protein 7.4 6.5 - 8.1 g/dL   Albumin 4.1 3.5 - 5.0 g/dL   AST 14 (L) 15 - 41 U/L   ALT 14 0 - 44 U/L   Alkaline Phosphatase 73 38 - 126 U/L   Total  Bilirubin 0.9 0.3 - 1.2 mg/dL   GFR calc non Af Amer >60 >60 mL/min   GFR calc Af Amer >60 >60 mL/min   Anion gap 12 5 - 15  I-Stat beta hCG blood, ED     Status: None   Collection Time: 02/22/20  5:07 PM  Result Value Ref Range   I-stat hCG, quantitative <5.0 <5 mIU/mL   Comment 3          Urinalysis, Routine w reflex microscopic- may I&O cath if menses     Status: Abnormal   Collection Time: 02/22/20  5:36 PM  Result Value Ref Range   Color, Urine AMBER (A) YELLOW   APPearance CLOUDY (A) CLEAR   Specific Gravity, Urine 1.023 1.005 - 1.030   pH 7.0 5.0 - 8.0   Glucose, UA NEGATIVE NEGATIVE mg/dL   Hgb urine dipstick NEGATIVE NEGATIVE   Bilirubin Urine NEGATIVE NEGATIVE   Ketones, ur NEGATIVE NEGATIVE mg/dL   Protein, ur 100 (A) NEGATIVE mg/dL   Nitrite POSITIVE (A) NEGATIVE   Leukocytes,Ua LARGE (A) NEGATIVE   RBC / HPF >50 (H) 0 - 5 RBC/hpf   WBC, UA >50 (H) 0 - 5 WBC/hpf   Bacteria, UA MANY (A) NONE SEEN   Squamous Epithelial / LPF 0-5 0 - 5   WBC Clumps PRESENT    Mucus PRESENT   Lactic acid, plasma     Status: None   Collection Time: 02/22/20  9:10 PM  Result Value Ref Range   Lactic Acid, Venous 1.9 0.5 - 1.9 mmol/L  Wet prep, genital     Status: Abnormal   Collection Time: 02/22/20  9:11 PM   Specimen: PATH Cytology Cervicovaginal Ancillary Only  Result Value Ref Range   Yeast Wet Prep HPF POC NONE SEEN NONE SEEN   Trich, Wet Prep NONE SEEN NONE SEEN   Clue Cells Wet Prep HPF POC NONE SEEN NONE SEEN   WBC, Wet Prep HPF POC PRESENT (A) NONE SEEN   Sperm NONE SEEN   Lactic acid, plasma     Status: None   Collection Time: 02/22/20 11:00 PM  Result Value Ref Range   Lactic Acid, Venous 1.0 0.5 - 1.9 mmol/L  APTT     Status: None   Collection Time: 02/22/20 11:00 PM  Result Value Ref Range   aPTT 34 24 - 36 seconds  Protime-INR     Status: Abnormal   Collection Time: 02/22/20 11:00 PM  Result Value Ref Range   Prothrombin Time 16.4 (H) 11.4 - 15.2 seconds   INR  1.3 (H) 0.8 - 1.2   Recent Results (from the past 240 hour(s))  Wet prep, genital     Status: Abnormal   Collection Time: 02/22/20  9:11 PM   Specimen: PATH Cytology Cervicovaginal Ancillary Only  Result Value Ref Range Status   Yeast Wet Prep HPF POC NONE SEEN NONE SEEN Final   Trich, Wet Prep NONE SEEN NONE SEEN Final   Clue Cells Wet Prep HPF POC NONE SEEN NONE SEEN Final   WBC, Wet Prep HPF POC PRESENT (A) NONE SEEN Final   Sperm NONE SEEN  Final    Comment: Performed at [facility], [address] [place], [place] [address]   Creatinine: Recent Labs    02/22/20 1701  CREATININE 0.86   CT scan personally reviewed and is detailed in the history of present illness  Impression/Assessment:  Right ureteral calculus Right hydronephrosis secondary to ureteral obstruction from calculus Sepsis secondary to urinary tract infection  Plan:  Plan for urgent right ureteral stent placement.  Appreciate hospitalist management and assistance with the patient.  Risk and benefits discussed.  She will need a right ureteroscopy down the road for definitive management of her stones.  Ray Church, III 02/23/2020, 12:29 AM  Is scant bloody

## 2020-02-23 NOTE — Transfer of Care (Signed)
Immediate Anesthesia Transfer of Care Note  Patient: Kendra Morgan  Procedure(s) Performed: CYSTOSCOPY WITH RETROGRADE PYELOGRAM AND RIGHT URETERAL STENT PLACEMENT (Right Ureter)  Patient Location: PACU  Anesthesia Type:General  Level of Consciousness: awake, alert , oriented and patient cooperative  Airway & Oxygen Therapy: Patient Spontanous Breathing and Patient connected to face mask oxygen  Post-op Assessment: Report given to RN, Post -op Vital signs reviewed and stable and Patient moving all extremities X 4  Post vital signs: stable  Last Vitals:  Vitals Value Taken Time  BP 104/58 02/23/20 0215  Temp 38.1 C 02/23/20 0206  Pulse 100 02/23/20 0216  Resp 0 02/23/20 0215  SpO2 100 % 02/23/20 0216  Vitals shown include unvalidated device data.  Last Pain:  Vitals:   02/23/20 0206  TempSrc:   PainSc: 0-No pain         Complications: No apparent anesthesia complications

## 2020-02-23 NOTE — Progress Notes (Signed)
[facility]  PROGRESS NOTE  Kendra Morgan RJJ:[id number] DOB: 1996-05-03 DOA: 02/22/2020 PCP: Patient, No Pcp Per   Brief HPI:   24 year old female with a history of ureteral calculi, UTI and prior bile hydronephrosis came to ED with flank pain, fever, headache, decreased appetite for about 1 week.  Pain was worse on the right side.Urology was consulted and patient underwent cystoscopy with right retrograde pyelogram with right ureteral stent placement   Subjective   Patient seen and examined, s/p cystoscopy with right ureteral stent placement.  Denies any pain.   Assessment/Plan:     1. Pyohydronephrosis-started on IV Rocephin, urine culture has been obtained and is currently pending.  Urology saw patient and she underwent cystoscopy with ureteral stent placement.  Will follow urine and blood culture results.    SpO2: 99 % O2 Flow Rate (L/min): 2 L/min   COVID-19 Labs  No results for input(s): DDIMER, FERRITIN, LDH, CRP in the last 72 hours.  Lab Results  Component Value Date   SARSCOV2NAA NEGATIVE 02/22/2020     CBG: No results for input(s): GLUCAP in the last 168 hours.  CBC: Recent Labs  Lab 02/22/20 1701 02/23/20 0513  WBC 19.1* 18.7*  HGB 12.5 12.2  HCT 37.8 37.5  MCV 94.0 92.8  PLT 352 273    Basic Metabolic Panel: Recent Labs  Lab 02/22/20 1701 02/23/20 0513  NA 140 140  K 3.4* 4.0  CL 102 103  CO2 26 28  GLUCOSE 112* 125*  BUN 6 6  CREATININE 0.86 0.69  CALCIUM 9.1 8.9     Liver Function Tests: Recent Labs  Lab 02/22/20 1701  AST 14*  ALT 14  ALKPHOS 73  BILITOT 0.9  PROT 7.4  ALBUMIN 4.1        DVT prophylaxis: Lovenox  Code Status: Full code  Family Communication: No family at bedside  Disposition Plan: Patient was admitted right pyohydronephrosis, s/p ureteral stent placement.  Barrier to discharge-pending urine culture results.        Scheduled medications:  . enoxaparin (LOVENOX) injection  40 mg  Subcutaneous Q24H    Consultants:  Urology  Procedures:  Cystoscopy with right ureteral stent placement  Antibiotics:   Anti-infectives (From admission, onward)   Start     Dose/Rate Route Frequency Ordered Stop   02/22/20 2115  cefTRIAXone (ROCEPHIN) 1 g in sodium chloride 0.9 % 100 mL IVPB     1 g 200 mL/hr over 30 Minutes Intravenous Every 24 hours 02/22/20 2057         Objective   Vitals:   02/23/20 0800 02/23/20 0856 02/23/20 1219 02/23/20 1624  BP: 103/63 93/63 (!) 90/48 (!) 81/51  Pulse: 72 80 76 70  Resp: 20 16 18 16   Temp:  (!) 97.5 F (36.4 C) 97.7 F (36.5 C) 98.9 F (37.2 C)  TempSrc:  Oral Oral Oral  SpO2: 98% 100% 100% 99%  Weight:      Height:        Intake/Output Summary (Last 24 hours) at 02/23/2020 1856 Last data filed at 02/23/2020 1459 Gross per 24 hour  Intake 2757 ml  Output 1790 ml  Net 967 ml    04/12 1901 - 04/14 0700 In: 2757 [P.O.:60; I.V.:1100] Out: 500 [Urine:500]  Filed Weights   02/22/20 1631  Weight: 49.9 kg    Physical Examination:   General-appears in no acute distress Heart-S1-S2, regular, no murmur auscultated Lungs-clear to auscultation bilaterally, no wheezing or crackles auscultated Abdomen-soft, nontender, no  organomegaly Extremities-no edema in the lower extremities Neuro-alert, oriented x3, no focal deficit noted   Data Reviewed:   Recent Results (from the past 240 hour(s))  Culture, blood (routine x 2)     Status: None (Preliminary result)   Collection Time: 02/22/20  8:36 PM   Specimen: BLOOD  Result Value Ref Range Status   Specimen Description BLOOD LEFT ANTECUBITAL  Final   Special Requests   Final    BOTTLES DRAWN AEROBIC AND ANAEROBIC Blood Culture adequate volume Performed at [facility], [address] [place], [place] [address]    Culture NO GROWTH < 12 HOURS  Final   Report Status PENDING  Incomplete  Culture, blood (routine x 2)     Status: None (Preliminary  result)   Collection Time: 02/22/20  8:41 PM   Specimen: BLOOD  Result Value Ref Range Status   Specimen Description BLOOD LEFT ANTECUBITAL  Final   Special Requests   Final    BOTTLES DRAWN AEROBIC AND ANAEROBIC Blood Culture adequate volume Performed at [facility], [address] [place], [place] [address]    Culture NO GROWTH < 12 HOURS  Final   Report Status PENDING  Incomplete  Wet prep, genital     Status: Abnormal   Collection Time: 02/22/20  9:11 PM   Specimen: PATH Cytology Cervicovaginal Ancillary Only  Result Value Ref Range Status   Yeast Wet Prep HPF POC NONE SEEN NONE SEEN Final   Trich, Wet Prep NONE SEEN NONE SEEN Final   Clue Cells Wet Prep HPF POC NONE SEEN NONE SEEN Final   WBC, Wet Prep HPF POC PRESENT (A) NONE SEEN Final   Sperm NONE SEEN  Final    Comment: Performed at [facility], [address] [place], [place] [address]  Respiratory Panel by RT PCR (Flu A&B, Covid) - Nasopharyngeal Swab     Status: None   Collection Time: 02/22/20 11:00 PM   Specimen: Nasopharyngeal Swab  Result Value Ref Range Status   SARS Coronavirus 2 by RT PCR NEGATIVE NEGATIVE Final    Comment: (NOTE) SARS-CoV-2 target nucleic acids are NOT DETECTED. The SARS-CoV-2 RNA is generally detectable in upper respiratoy specimens during the acute phase of infection. The lowest concentration of SARS-CoV-2 viral copies this assay can detect is 131 copies/mL. A negative result does not preclude SARS-Cov-2 infection and should not be used as the sole basis for treatment or other patient management decisions. A negative result may occur with  improper specimen collection/handling, submission of specimen other than nasopharyngeal swab, presence of viral mutation(s) within the areas targeted by this assay, and inadequate number of viral copies (<131 copies/mL). A negative result must be combined with clinical observations, patient history, and  epidemiological information. The expected result is Negative. Fact Sheet for Patients:  https://www.moore.com/ Fact Sheet for Healthcare Providers:  https://www.young.biz/ This test is not yet ap proved or cleared by the [place] FDA and  has been authorized for detection and/or diagnosis of SARS-CoV-2 by FDA under an Emergency Use Authorization (EUA). This EUA will remain  in effect (meaning this test can be used) for the duration of the COVID-19 declaration under Section 564(b)(1) of the Act, 21 U.S.C. section 360bbb-3(b)(1), unless the authorization is terminated or revoked sooner.    Influenza A by PCR NEGATIVE NEGATIVE Final   Influenza B by PCR NEGATIVE NEGATIVE Final    Comment: (NOTE) The Xpert Xpress SARS-CoV-2/FLU/RSV assay is intended as an aid  in  the diagnosis of influenza from Nasopharyngeal swab specimens and  should not be used as a sole basis for treatment. Nasal washings and  aspirates are unacceptable for Xpert Xpress SARS-CoV-2/FLU/RSV  testing. Fact Sheet for Patients: https://www.moore.com/ Fact Sheet for Healthcare Providers: https://www.young.biz/ This test is not yet approved or cleared by the [place] FDA and  has been authorized for detection and/or diagnosis of SARS-CoV-2 by  FDA under an Emergency Use Authorization (EUA). This EUA will remain  in effect (meaning this test can be used) for the duration of the  Covid-19 declaration under Section 564(b)(1) of the Act, 21  U.S.C. section 360bbb-3(b)(1), unless the authorization is  terminated or revoked. Performed at [facility], [address], [place], [place] [address]     No results for input(s): LIPASE, AMYLASE in the last 168 hours. No results for input(s): AMMONIA in the last 168 hours.  Cardiac Enzymes: No results for input(s): CKTOTAL, CKMB, CKMBINDEX, TROPONINI in the last 168  hours. BNP (last 3 results) No results for input(s): BNP in the last 8760 hours.  ProBNP (last 3 results) No results for input(s): PROBNP in the last 8760 hours.  Studies:  DG Chest Port 1 View  Result Date: 02/22/2020 CLINICAL DATA:  Sepsis fever EXAM: PORTABLE CHEST 1 VIEW COMPARISON:  07/13/2018 FINDINGS: The heart size and mediastinal contours are within normal limits. Both lungs are clear. The visualized skeletal structures are unremarkable. IMPRESSION: No active disease. Electronically Signed   By: Marlan Palau M.D.   On: 02/22/2020 21:18   DG C-Arm 1-60 Min-No Report  Result Date: 02/23/2020 Fluoroscopy was utilized by the requesting physician.  No radiographic interpretation.   CT Renal Stone Study  Result Date: 02/22/2020 CLINICAL DATA:  Right-sided abdominal pain with history of renal calculi EXAM: CT ABDOMEN AND PELVIS WITHOUT CONTRAST TECHNIQUE: Multidetector CT imaging of the abdomen and pelvis was performed following the standard protocol without IV contrast. COMPARISON:  07/14/2018 FINDINGS: Lower chest: No acute abnormality. Hepatobiliary: No focal liver abnormality is seen. No gallstones, gallbladder wall thickening, or biliary dilatation. Pancreas: Unremarkable. No pancreatic ductal dilatation or surrounding inflammatory changes. Spleen: Normal in size without focal abnormality. Adrenals/Urinary Tract: Adrenal glands are well visualized. Left adrenal adenoma is again seen and stable. The left kidney demonstrates a few small renal calculi. The largest of these measures 4 mm. This is slightly enlarged when compare with the prior examination. Hydronephrosis is noted on the right with hydroureter extending to a 9 mm obstructing stone in the right ureter. A second calculus is noted at the right UVJ which measures approximately 6 mm also consist right ureteral stone. Small nonobstructing lower pole renal stone is noted on the right as well. The bladder is decompressed. Stomach/Bowel:  The appendix is not well visualized. No inflammatory changes are seen to suggest appendicitis. No small bowel or gastric abnormality is noted. Vascular/Lymphatic: No significant vascular findings are present. No enlarged abdominal or pelvic lymph nodes. Reproductive: Uterus and bilateral adnexa are unremarkable. Other: No abdominal wall hernia or abnormality. No abdominopelvic ascites. Musculoskeletal: No acute or significant osseous findings. IMPRESSION: Two right ureteral stones as described with significant hydronephrosis. Possibility of underlying pyelonephritis would deserve consideration as well although evaluation is limited due to lack of IV contrast. Bilateral nonobstructing stones increased when compared with the prior exam. Stable left adrenal adenoma. No other focal abnormality is seen. Electronically Signed   By: Alcide Clever M.D.   On: 02/22/2020 22:32  Admission status: The appropriate admission status for this patient is INPATIENT. Inpatient status is judged to be reasonable and necessary in order to provide the required intensity of service to ensure the patient's safety. The patient's presenting symptoms, physical exam findings, and initial radiographic and laboratory data in the context of their chronic comorbidities is felt to place them at high risk for further clinical deterioration. Furthermore, it is not anticipated that the patient will be medically stable for discharge from the hospital within 2 midnights of admission. The following factors support the admission status of inpatient.    The patient's presenting symptoms include flank pain The worrisome physical exam findings include flank tenderness The initial radiographic and laboratory data are worrisome because of pyohydronephrosis The chronic co-morbidities include kidney stones    * I certify that at the point of admission it is my clinical judgment that the patient will require inpatient hospital care spanning beyond 2  midnights from the point of admission due to high intensity of service, high risk for further deterioration and high frequency of surveillance required.Oswald Hillock   [facility] If 7PM-7AM, please contact night-coverage at [email or web address], Office  [phone]   02/23/2020, 6:56 PM  LOS: 1 day

## 2020-02-23 NOTE — Anesthesia Preprocedure Evaluation (Signed)
Anesthesia Evaluation  Patient identified by MRN, date of birth, ID band Patient awake    Reviewed: Allergy & Precautions, NPO status , Patient's Chart, lab work & pertinent test results  Airway Mallampati: II  TM Distance: >3 FB     Dental  (+) Dental Advisory Given   Pulmonary neg pulmonary ROS,    breath sounds clear to auscultation       Cardiovascular negative cardio ROS   Rhythm:Regular Rate:Normal     Neuro/Psych negative neurological ROS     GI/Hepatic Neg liver ROS, GERD  ,  Endo/Other  negative endocrine ROS  Renal/GU Renal disease     Musculoskeletal negative musculoskeletal ROS (+)   Abdominal   Peds  Hematology negative hematology ROS (+)   Anesthesia Other Findings   Reproductive/Obstetrics                             Lab Results  Component Value Date   WBC 19.1 (H) 02/22/2020   HGB 12.5 02/22/2020   HCT 37.8 02/22/2020   MCV 94.0 02/22/2020   PLT 352 02/22/2020   Lab Results  Component Value Date   CREATININE 0.86 02/22/2020   BUN 6 02/22/2020   NA 140 02/22/2020   K 3.4 (L) 02/22/2020   CL 102 02/22/2020   CO2 26 02/22/2020    Anesthesia Physical Anesthesia Plan  ASA: II and emergent  Anesthesia Plan: General   Post-op Pain Management:    Induction: Intravenous and Rapid sequence  PONV Risk Score and Plan: 3 and Dexamethasone, Ondansetron and Treatment may vary due to age or medical condition  Airway Management Planned: Oral ETT  Additional Equipment: None  Intra-op Plan:   Post-operative Plan: Extubation in OR  Informed Consent: I have reviewed the patients History and Physical, chart, labs and discussed the procedure including the risks, benefits and alternatives for the proposed anesthesia with the patient or authorized representative who has indicated his/her understanding and acceptance.     Dental advisory given  Plan Discussed with:  CRNA  Anesthesia Plan Comments:         Anesthesia Quick Evaluation

## 2020-02-23 NOTE — Discharge Instructions (Signed)

## 2020-02-24 ENCOUNTER — Encounter: Payer: Self-pay | Admitting: *Deleted

## 2020-02-24 LAB — CBC
HCT: 33.7 % — ABNORMAL LOW (ref 36.0–46.0)
Hemoglobin: 11.3 g/dL — ABNORMAL LOW (ref 12.0–15.0)
MCH: 30.7 pg (ref 26.0–34.0)
MCHC: 33.5 g/dL (ref 30.0–36.0)
MCV: 91.6 fL (ref 80.0–100.0)
Platelets: 318 10*3/uL (ref 150–400)
RBC: 3.68 MIL/uL — ABNORMAL LOW (ref 3.87–5.11)
RDW: 12 % (ref 11.5–15.5)
WBC: 18.9 10*3/uL — ABNORMAL HIGH (ref 4.0–10.5)
nRBC: 0 % (ref 0.0–0.2)

## 2020-02-24 LAB — BASIC METABOLIC PANEL
Anion gap: 10 (ref 5–15)
BUN: 8 mg/dL (ref 6–20)
CO2: 26 mmol/L (ref 22–32)
Calcium: 9.2 mg/dL (ref 8.9–10.3)
Chloride: 105 mmol/L (ref 98–111)
Creatinine, Ser: 0.5 mg/dL (ref 0.44–1.00)
GFR calc Af Amer: >60 mL/min (ref >60)
GFR calc non Af Amer: >60 mL/min (ref >60)
Glucose, Bld: 115 mg/dL — ABNORMAL HIGH (ref 70–99)
Potassium: 3.8 mmol/L (ref 3.5–5.1)
Sodium: 141 mmol/L (ref 135–145)

## 2020-02-24 LAB — GC/CHLAMYDIA PROBE AMP (~~LOC~~) NOT AT ARMC
Chlamydia: NEGATIVE
Comment: NEGATIVE
Comment: NORMAL
Neisseria Gonorrhea: NEGATIVE

## 2020-02-24 NOTE — Progress Notes (Signed)
[facility]  PROGRESS NOTE  Kendra Morgan FXJ:[id number] DOB: [birth year] DOA: 02/22/2020 PCP: Patient, No Pcp Per   Brief HPI:   24 year old female with a history of ureteral calculi, UTI and prior bile hydronephrosis came to ED with flank pain, fever, headache, decreased appetite for about 1 week.  Pain was worse on the right side.Urology was consulted and patient underwent cystoscopy with right retrograde pyelogram with right ureteral stent placement   Subjective   Patient seen and examined, urine culture growing E. coli   Assessment/Plan:     1. Pyohydronephrosis-started on IV Rocephin, urine culture has been obtained and is currently pending.  Urology saw patient and she underwent cystoscopy with ureteral stent placement.  Urine culture is growing E. coli.  Follow final urine culture sensitivity.  Blood cultures x2 negative to date.      SpO2: 100 % O2 Flow Rate (L/min): 2 L/min   COVID-19 Labs  No results for input(s): DDIMER, FERRITIN, LDH, CRP in the last 72 hours.  Lab Results  Component Value Date   SARSCOV2NAA NEGATIVE 02/22/2020     CBG: No results for input(s): GLUCAP in the last 168 hours.  CBC: Recent Labs  Lab 02/22/20 1701 02/23/20 0513 02/24/20 0559  WBC 19.1* 18.7* 18.9*  HGB 12.5 12.2 11.3*  HCT 37.8 37.5 33.7*  MCV 94.0 92.8 91.6  PLT 352 273 318    Basic Metabolic Panel: Recent Labs  Lab 02/22/20 1701 02/23/20 0513 02/24/20 0559  NA 140 140 141  K 3.4* 4.0 3.8  CL 102 103 105  CO2 26 28 26   GLUCOSE 112* 125* 115*  BUN 6 6 8   CREATININE 0.86 0.69 0.50  CALCIUM 9.1 8.9 9.2     Liver Function Tests: Recent Labs  Lab 02/22/20 1701  AST 14*  ALT 14  ALKPHOS 73  BILITOT 0.9  PROT 7.4  ALBUMIN 4.1        DVT prophylaxis: Lovenox  Code Status: Full code  Family Communication: No family at bedside  Disposition Plan: Patient was admitted right pyohydronephrosis, s/p ureteral stent placement.  Barrier to  discharge-pending urine culture results.        Scheduled medications:  . enoxaparin (LOVENOX) injection  40 mg Subcutaneous Q24H    Consultants:  Urology  Procedures:  Cystoscopy with right ureteral stent placement  Antibiotics:   Anti-infectives (From admission, onward)   Start     Dose/Rate Route Frequency Ordered Stop   02/22/20 2115  cefTRIAXone (ROCEPHIN) 1 g in sodium chloride 0.9 % 100 mL IVPB     1 g 200 mL/hr over 30 Minutes Intravenous Every 24 hours 02/22/20 2057         Objective   Vitals:   02/23/20 1624 02/23/20 2030 02/24/20 0519 02/24/20 1435  BP: (!) 81/51 107/66 104/65 98/64  Pulse: 70 91 76 79  Resp: 16 17 18 20   Temp: 98.9 F (37.2 C) 98.1 F (36.7 C) 98 F (36.7 C) 98.3 F (36.8 C)  TempSrc: Oral Oral Oral   SpO2: 99% 100% 100% 100%  Weight:      Height:        Intake/Output Summary (Last 24 hours) at 02/24/2020 1705 Last data filed at 02/24/2020 1344 Gross per 24 hour  Intake 472 ml  Output 450 ml  Net 22 ml    04/13 1901 - 04/15 0700 In: 2757 [P.O.:60; I.V.:1100] Out: 2240 [Urine:2240]  Filed Weights   02/22/20 1631  Weight: 49.9 kg  Physical Examination:   General-appears in no acute distress Heart-S1-S2, regular, no murmur auscultated Lungs-clear to auscultation bilaterally, no wheezing or crackles auscultated Abdomen-soft, nontender, no organomegaly Extremities-no edema in the lower extremities Neuro-alert, oriented x3, no focal deficit noted  Data Reviewed:   Recent Results (from the past 240 hour(s))  Urine culture     Status: Abnormal (Preliminary result)   Collection Time: 02/22/20  5:36 PM   Specimen: Urine, Random  Result Value Ref Range Status   Specimen Description   Final    URINE, RANDOM Performed at [facility], [address] [place], [place] [address]    Special Requests   Final    NONE Performed at [facility], [address] [place], [place]  [address]    Culture >=100,000 COLONIES/mL ESCHERICHIA COLI (A)  Final   Report Status PENDING  Incomplete  Culture, blood (routine x 2)     Status: None (Preliminary result)   Collection Time: 02/22/20  8:36 PM   Specimen: BLOOD  Result Value Ref Range Status   Specimen Description   Final    BLOOD LEFT ANTECUBITAL Performed at [facility], [address] [place], [place] [address]    Special Requests   Final    BOTTLES DRAWN AEROBIC AND ANAEROBIC Blood Culture adequate volume Performed at [facility], [address] [place], [place] [address]    Culture   Final    NO GROWTH 1 DAY Performed at [facility], [address], [place], [place] [address]    Report Status PENDING  Incomplete  Culture, blood (routine x 2)     Status: None (Preliminary result)   Collection Time: 02/22/20  8:41 PM   Specimen: BLOOD  Result Value Ref Range Status   Specimen Description   Final    BLOOD LEFT ANTECUBITAL Performed at [facility], [address] [place], [place] [address]    Special Requests   Final    BOTTLES DRAWN AEROBIC AND ANAEROBIC Blood Culture adequate volume Performed at [facility], [address], [place], [place] [address]    Culture   Final    NO GROWTH 1 DAY Performed at [facility], [address] [place], [place] [address]    Report Status PENDING  Incomplete  Wet prep, genital     Status: Abnormal   Collection Time: 02/22/20  9:11 PM   Specimen: PATH Cytology Cervicovaginal Ancillary Only  Result Value Ref Range Status   Yeast Wet Prep HPF POC NONE SEEN NONE SEEN Final   Trich, Wet Prep NONE SEEN NONE SEEN Final   Clue Cells Wet Prep HPF POC NONE SEEN NONE SEEN Final   WBC, Wet Prep HPF POC PRESENT (A) NONE SEEN Final   Sperm NONE SEEN  Final    Comment: Performed at [facility], [address] [place], [place] [address]  Respiratory Panel by RT PCR  (Flu A&B, Covid) - Nasopharyngeal Swab     Status: None   Collection Time: 02/22/20 11:00 PM   Specimen: Nasopharyngeal Swab  Result Value Ref Range Status   SARS Coronavirus 2 by RT PCR NEGATIVE NEGATIVE Final    Comment: (NOTE) SARS-CoV-2 target nucleic acids are NOT DETECTED. The SARS-CoV-2 RNA is generally detectable in upper respiratoy specimens during the acute phase of infection. The lowest concentration of SARS-CoV-2 viral copies this assay can detect is 131 copies/mL. A negative result does  not preclude SARS-Cov-2 infection and should not be used as the sole basis for treatment or other patient management decisions. A negative result may occur with  improper specimen collection/handling, submission of specimen other than nasopharyngeal swab, presence of viral mutation(s) within the areas targeted by this assay, and inadequate number of viral copies (<131 copies/mL). A negative result must be combined with clinical observations, patient history, and epidemiological information. The expected result is Negative. Fact Sheet for Patients:  PinkCheek.be Fact Sheet for Healthcare Providers:  GravelBags.it This test is not yet ap proved or cleared by the [place] FDA and  has been authorized for detection and/or diagnosis of SARS-CoV-2 by FDA under an Emergency Use Authorization (EUA). This EUA will remain  in effect (meaning this test can be used) for the duration of the COVID-19 declaration under Section 564(b)(1) of the Act, 21 U.S.C. section 360bbb-3(b)(1), unless the authorization is terminated or revoked sooner.    Influenza A by PCR NEGATIVE NEGATIVE Final   Influenza B by PCR NEGATIVE NEGATIVE Final    Comment: (NOTE) The Xpert Xpress SARS-CoV-2/FLU/RSV assay is intended as an aid in  the diagnosis of influenza from Nasopharyngeal swab specimens and  should not be used as a sole basis for treatment. Nasal  washings and  aspirates are unacceptable for Xpert Xpress SARS-CoV-2/FLU/RSV  testing. Fact Sheet for Patients: PinkCheek.be Fact Sheet for Healthcare Providers: GravelBags.it This test is not yet approved or cleared by the [place] FDA and  has been authorized for detection and/or diagnosis of SARS-CoV-2 by  FDA under an Emergency Use Authorization (EUA). This EUA will remain  in effect (meaning this test can be used) for the duration of the  Covid-19 declaration under Section 564(b)(1) of the Act, 21  U.S.C. section 360bbb-3(b)(1), unless the authorization is  terminated or revoked. Performed at [facility], [place] [address], [place], [place] [address]     No results for input(s): LIPASE, AMYLASE in the last 168 hours. No results for input(s): AMMONIA in the last 168 hours.  Cardiac Enzymes: No results for input(s): CKTOTAL, CKMB, CKMBINDEX, TROPONINI in the last 168 hours. BNP (last 3 results) No results for input(s): BNP in the last 8760 hours.  ProBNP (last 3 results) No results for input(s): PROBNP in the last 8760 hours.  Studies:  DG Chest Port 1 View  Result Date: 02/22/2020 CLINICAL DATA:  Sepsis fever EXAM: PORTABLE CHEST 1 VIEW COMPARISON:  07/13/2018 FINDINGS: The heart size and mediastinal contours are within normal limits. Both lungs are clear. The visualized skeletal structures are unremarkable. IMPRESSION: No active disease. Electronically Signed   By: Franchot Gallo M.D.   On: 02/22/2020 21:18   DG C-Arm 1-60 Min-No Report  Result Date: 02/23/2020 Fluoroscopy was utilized by the requesting physician.  No radiographic interpretation.   CT Renal Stone Study  Result Date: 02/22/2020 CLINICAL DATA:  Right-sided abdominal pain with history of renal calculi EXAM: CT ABDOMEN AND PELVIS WITHOUT CONTRAST TECHNIQUE: Multidetector CT imaging of the abdomen and pelvis was performed following  the standard protocol without IV contrast. COMPARISON:  07/14/2018 FINDINGS: Lower chest: No acute abnormality. Hepatobiliary: No focal liver abnormality is seen. No gallstones, gallbladder wall thickening, or biliary dilatation. Pancreas: Unremarkable. No pancreatic ductal dilatation or surrounding inflammatory changes. Spleen: Normal in size without focal abnormality. Adrenals/Urinary Tract: Adrenal glands are well visualized. Left adrenal adenoma is again seen and stable. The left kidney demonstrates a few small renal calculi. The largest of these measures 4  mm. This is slightly enlarged when compare with the prior examination. Hydronephrosis is noted on the right with hydroureter extending to a 9 mm obstructing stone in the right ureter. A second calculus is noted at the right UVJ which measures approximately 6 mm also consist right ureteral stone. Small nonobstructing lower pole renal stone is noted on the right as well. The bladder is decompressed. Stomach/Bowel: The appendix is not well visualized. No inflammatory changes are seen to suggest appendicitis. No small bowel or gastric abnormality is noted. Vascular/Lymphatic: No significant vascular findings are present. No enlarged abdominal or pelvic lymph nodes. Reproductive: Uterus and bilateral adnexa are unremarkable. Other: No abdominal wall hernia or abnormality. No abdominopelvic ascites. Musculoskeletal: No acute or significant osseous findings. IMPRESSION: Two right ureteral stones as described with significant hydronephrosis. Possibility of underlying pyelonephritis would deserve consideration as well although evaluation is limited due to lack of IV contrast. Bilateral nonobstructing stones increased when compared with the prior exam. Stable left adrenal adenoma. No other focal abnormality is seen. Electronically Signed   By: Alcide Clever M.D.   On: 02/22/2020 22:32     Admission status: The appropriate admission status for this patient is  INPATIENT. Inpatient status is judged to be reasonable and necessary in order to provide the required intensity of service to ensure the patient's safety. The patient's presenting symptoms, physical exam findings, and initial radiographic and laboratory data in the context of their chronic comorbidities is felt to place them at high risk for further clinical deterioration. Furthermore, it is not anticipated that the patient will be medically stable for discharge from the hospital within 2 midnights of admission. The following factors support the admission status of inpatient.    The patient's presenting symptoms include flank pain The worrisome physical exam findings include flank tenderness The initial radiographic and laboratory data are worrisome because of pyohydronephrosis The chronic co-morbidities include kidney stones    * I certify that at the point of admission it is my clinical judgment that the patient will require inpatient hospital care spanning beyond 2 midnights from the point of admission due to high intensity of service, high risk for further deterioration and high frequency of surveillance required.Meredeth Ide   [facility] If 7PM-7AM, please contact night-coverage at [email or web address], Office  [phone]   02/24/2020, 5:05 PM  LOS: 2 days

## 2020-02-25 LAB — CBC
HCT: 33.3 % — ABNORMAL LOW (ref 36.0–46.0)
Hemoglobin: 10.8 g/dL — ABNORMAL LOW (ref 12.0–15.0)
MCH: 30.3 pg (ref 26.0–34.0)
MCHC: 32.4 g/dL (ref 30.0–36.0)
MCV: 93.3 fL (ref 80.0–100.0)
Platelets: 306 10*3/uL (ref 150–400)
RBC: 3.57 MIL/uL — ABNORMAL LOW (ref 3.87–5.11)
RDW: 12.2 % (ref 11.5–15.5)
WBC: 11.2 10*3/uL — ABNORMAL HIGH (ref 4.0–10.5)
nRBC: 0 % (ref 0.0–0.2)

## 2020-02-25 LAB — URINE CULTURE: Culture: >=100000 — AB

## 2020-02-25 LAB — BASIC METABOLIC PANEL
Anion gap: 9 (ref 5–15)
BUN: 8 mg/dL (ref 6–20)
CO2: 25 mmol/L (ref 22–32)
Calcium: 8.6 mg/dL — ABNORMAL LOW (ref 8.9–10.3)
Chloride: 105 mmol/L (ref 98–111)
Creatinine, Ser: 0.58 mg/dL (ref 0.44–1.00)
GFR calc Af Amer: >60 mL/min (ref >60)
GFR calc non Af Amer: >60 mL/min (ref >60)
Glucose, Bld: 86 mg/dL (ref 70–99)
Potassium: 3.5 mmol/L (ref 3.5–5.1)
Sodium: 139 mmol/L (ref 135–145)

## 2020-02-25 MED ORDER — CEPHALEXIN 500 MG PO CAPS: 500.0000 mg | ORAL_CAPSULE | Freq: Two times a day (BID) | ORAL | 0 refills | Status: AC

## 2020-02-25 NOTE — Anesthesia Postprocedure Evaluation (Signed)
Anesthesia Post Note  Patient: [name]  Procedure(s) Performed: CYSTOSCOPY WITH RETROGRADE PYELOGRAM AND RIGHT URETERAL STENT PLACEMENT (Right Ureter)     Patient location during evaluation: PACU Anesthesia Type: General Level of consciousness: awake and alert Pain management: pain level controlled Vital Signs Assessment: post-procedure vital signs reviewed and stable Respiratory status: spontaneous breathing, nonlabored ventilation, respiratory function stable and patient connected to nasal cannula oxygen Cardiovascular status: blood pressure returned to baseline and stable Postop Assessment: no apparent nausea or vomiting Anesthetic complications: no    Last Vitals:  Vitals:   02/24/20 2035 02/25/20 0540  BP: (!) 98/54 93/72  Pulse: 95 78  Resp: 20 20  Temp: 36.9 C 36.7 C  SpO2: 99% 98%    Last Pain:  Vitals:   02/25/20 0800  TempSrc:   PainSc: 0-No pain                 Kennieth Rad

## 2020-02-25 NOTE — Progress Notes (Signed)
Pt discharged to home with mother. Discharge instructions and medication education provided to pt.

## 2020-02-25 NOTE — TOC Transition Note (Signed)
Transition of Care [facility]) - CM/SW Discharge Note   Patient Details  Name: Kendra Morgan MRN: [id number] Date of Birth: 1996-05-22  [facility] of [facility] CM/SW Contact:  Shade Flood, LCSW Phone Number: 02/25/2020, 11:38 AM   Clinical Narrative:     Pt stable for dc home today per MD. Pt needing oral antibiotic at dc and she does not currently have medical insurance. Met with pt to discuss. Pt states she and her mother live together and mother will transport her home at dc. Pt indicates she is attempting to apply for Medicaid. Discussed need for prescription assistance at dc. Provided pt with [facility] voucher and instructions were provided verbally and in written format. Pt did not have any additional questions or concerns.  Final next level of care: Home/Self Care Barriers to Discharge: Barriers Resolved   Patient Goals and CMS Choice        Discharge Placement                       Discharge Plan and Services In-house Referral: Clinical Social Work Discharge Planning Services: [facility]                                 Social Determinants of Health (SDOH) Interventions     Readmission Risk Interventions No flowsheet data found.

## 2020-02-25 NOTE — Discharge Summary (Signed)
Physician Discharge Summary  Kendra Morgan JME:[id number] DOB: 1996-07-31 DOA: 02/22/2020  PCP: Patient, No Pcp Per  Admit date: 02/22/2020 Discharge date: 02/25/2020  Time spent: 50 minutes  Recommendations for Outpatient Follow-up:   1. Follow-up urology in 1 week   Discharge Diagnoses:  Principal Problem:   Pyohydronephrosis Active Problems:   Sepsis secondary to UTI [facility])   Nephrolithiasis   Hydronephrosis of right kidney   UTI (urinary tract infection)   Discharge Condition: Stable  Diet recommendation: Regular diet  Filed Weights   02/22/20 1631  Weight: 49.9 kg    History of present illness:  24 year old female with a history of ureteral calculi, UTI and prior bile hydronephrosis came to ED with flank pain, fever, headache, decreased appetite for about 1 week.  Pain was worse on the right side.Urology was consulted and patient underwent cystoscopy with right retrograde pyelogram with right ureteral stent placement   Hospital Course:   Pyohydronephrosis-patient underwent cystoscopy with lateral mid pyelogram and right ureteral stent placement.  She was started on IV Rocephin.  Urine culture grew E. coli, sensitive to cefazolin.  Will discharge patient on Keflex 500 mg p.o. twice daily for 7 more days.  Patient to follow-up with urology in 1 week.  Procedures:  Right retrograde pyelogram with right ureteral stent placement  Consultations:  Urology  Discharge Exam: Vitals:   02/24/20 2035 02/25/20 0540  BP: (!) 98/54 93/72  Pulse: 95 78  Resp: 20 20  Temp: 98.4 F (36.9 C) 98.1 F (36.7 C)  SpO2: 99% 98%    General: Appears in no acute distress Cardiovascular: S1-S2, regular Respiratory: Clear to auscultation bilaterally  Discharge Instructions   Discharge Instructions    Diet - low sodium heart healthy   Complete by: As directed    Increase activity slowly   Complete by: As directed      Allergies as of 02/25/2020   No Known Allergies      Medication List    STOP taking these medications   HYDROcodone-acetaminophen 5-325 MG tablet Commonly known as: NORCO/VICODIN   ketorolac 10 MG tablet Commonly known as: TORADOL     TAKE these medications   acetaminophen 500 MG tablet Commonly known as: TYLENOL Take 1,000 mg by mouth every 6 (six) hours as needed for mild pain.   cephALEXin 500 MG capsule Commonly known as: Keflex Take 1 capsule (500 mg total) by mouth 2 (two) times daily for 7 days. What changed: additional instructions   diphenhydrAMINE 25 mg capsule Commonly known as: Benadryl Allergy Take 2 capsules (50 mg total) by mouth every 6 (six) hours as needed for allergies.   EPINEPHrine 0.3 mg/0.3 mL Soaj injection Commonly known as: EpiPen 2-Pak Inject 0.3 mLs (0.3 mg total) into the muscle once for 1 dose.   famotidine 20 MG tablet Commonly known as: PEPCID Take 1 tablet (20 mg total) by mouth 2 (two) times daily as needed (allergic reaction).   ondansetron 4 MG tablet Commonly known as: ZOFRAN Take 1 tablet (4 mg total) by mouth every 6 (six) hours as needed for nausea.   senna-docusate 8.6-50 MG tablet Commonly known as: Senokot-S Take 1 tablet by mouth at bedtime as needed for mild constipation.      No Known Allergies Follow-up Information    Ray Church III, MD Follow up in 1 week(s).   Specialty: Urology Contact information: [address] [place] [address] [phone]  The results of significant diagnostics from this hospitalization (including imaging, microbiology, ancillary and laboratory) are listed below for reference.    Significant Diagnostic Studies: DG Chest Port 1 View  Result Date: 02/22/2020 CLINICAL DATA:  Sepsis fever EXAM: PORTABLE CHEST 1 VIEW COMPARISON:  07/13/2018 FINDINGS: The heart size and mediastinal contours are within normal limits. Both lungs are clear. The visualized skeletal structures are unremarkable. IMPRESSION: No active  disease. Electronically Signed   By: Marlan Palau M.D.   On: 02/22/2020 21:18   DG C-Arm 1-60 Min-No Report  Result Date: 02/23/2020 Fluoroscopy was utilized by the requesting physician.  No radiographic interpretation.   CT Renal Stone Study  Result Date: 02/22/2020 CLINICAL DATA:  Right-sided abdominal pain with history of renal calculi EXAM: CT ABDOMEN AND PELVIS WITHOUT CONTRAST TECHNIQUE: Multidetector CT imaging of the abdomen and pelvis was performed following the standard protocol without IV contrast. COMPARISON:  07/14/2018 FINDINGS: Lower chest: No acute abnormality. Hepatobiliary: No focal liver abnormality is seen. No gallstones, gallbladder wall thickening, or biliary dilatation. Pancreas: Unremarkable. No pancreatic ductal dilatation or surrounding inflammatory changes. Spleen: Normal in size without focal abnormality. Adrenals/Urinary Tract: Adrenal glands are well visualized. Left adrenal adenoma is again seen and stable. The left kidney demonstrates a few small renal calculi. The largest of these measures 4 mm. This is slightly enlarged when compare with the prior examination. Hydronephrosis is noted on the right with hydroureter extending to a 9 mm obstructing stone in the right ureter. A second calculus is noted at the right UVJ which measures approximately 6 mm also consist right ureteral stone. Small nonobstructing lower pole renal stone is noted on the right as well. The bladder is decompressed. Stomach/Bowel: The appendix is not well visualized. No inflammatory changes are seen to suggest appendicitis. No small bowel or gastric abnormality is noted. Vascular/Lymphatic: No significant vascular findings are present. No enlarged abdominal or pelvic lymph nodes. Reproductive: Uterus and bilateral adnexa are unremarkable. Other: No abdominal wall hernia or abnormality. No abdominopelvic ascites. Musculoskeletal: No acute or significant osseous findings. IMPRESSION: Two right ureteral  stones as described with significant hydronephrosis. Possibility of underlying pyelonephritis would deserve consideration as well although evaluation is limited due to lack of IV contrast. Bilateral nonobstructing stones increased when compared with the prior exam. Stable left adrenal adenoma. No other focal abnormality is seen. Electronically Signed   By: Alcide Clever M.D.   On: 02/22/2020 22:32    Microbiology: Recent Results (from the past 240 hour(s))  Urine culture     Status: Abnormal   Collection Time: 02/22/20  5:36 PM   Specimen: Urine, Random  Result Value Ref Range Status   Specimen Description   Final    URINE, RANDOM Performed at [facility], [address] [place], [place] [address]    Special Requests   Final    NONE Performed at [facility], [address], [place], [place] [address]    Culture >=100,000 COLONIES/mL ESCHERICHIA COLI (A)  Final   Report Status 02/25/2020 FINAL  Final   Organism ID, Bacteria ESCHERICHIA COLI (A)  Final      Susceptibility   Escherichia coli - MIC*    AMPICILLIN >=32 RESISTANT Resistant     CEFAZOLIN <=4 SENSITIVE Sensitive     CEFTRIAXONE <=0.25 SENSITIVE Sensitive     CIPROFLOXACIN >=4 RESISTANT Resistant     GENTAMICIN <=1 SENSITIVE Sensitive     IMIPENEM <=0.25 SENSITIVE Sensitive     NITROFURANTOIN <=16  SENSITIVE Sensitive     TRIMETH/SULFA <=20 SENSITIVE Sensitive     AMPICILLIN/SULBACTAM >=32 RESISTANT Resistant     PIP/TAZO <=4 SENSITIVE Sensitive     * >=100,000 COLONIES/mL ESCHERICHIA COLI  Culture, blood (routine x 2)     Status: None (Preliminary result)   Collection Time: 02/22/20  8:36 PM   Specimen: BLOOD  Result Value Ref Range Status   Specimen Description   Final    BLOOD LEFT ANTECUBITAL Performed at [place] [address] [place], [place] [address]    Special Requests   Final    BOTTLES DRAWN AEROBIC AND ANAEROBIC Blood Culture adequate  volume Performed at [place] [address], [place], [place] [address]    Culture   Final    NO GROWTH 1 DAY Performed at [facility], [place] [address] [place], [place] [address]    Report Status PENDING  Incomplete  Culture, blood (routine x 2)     Status: None (Preliminary result)   Collection Time: 02/22/20  8:41 PM   Specimen: BLOOD  Result Value Ref Range Status   Specimen Description   Final    BLOOD LEFT ANTECUBITAL Performed at [place] [address] [place], [place] [address]    Special Requests   Final    BOTTLES DRAWN AEROBIC AND ANAEROBIC Blood Culture adequate volume Performed at [place] [address], [place], [place] [address]    Culture   Final    NO GROWTH 1 DAY Performed at [facility], [place] [address] [place], [place] [address]    Report Status PENDING  Incomplete  Wet prep, genital     Status: Abnormal   Collection Time: 02/22/20  9:11 PM   Specimen: PATH Cytology Cervicovaginal Ancillary Only  Result Value Ref Range Status   Yeast Wet Prep HPF POC NONE SEEN NONE SEEN Final   Trich, Wet Prep NONE SEEN NONE SEEN Final   Clue Cells Wet Prep HPF POC NONE SEEN NONE SEEN Final   WBC, Wet Prep HPF POC PRESENT (A) NONE SEEN Final   Sperm NONE SEEN  Final    Comment: Performed at [facility], [place] [address] [place],  [address]  Respiratory Panel by RT PCR (Flu A&B, Covid) - Nasopharyngeal Swab     Status: None   Collection Time: 02/22/20 11:00 PM   Specimen: Nasopharyngeal Swab  Result Value Ref Range Status   SARS Coronavirus 2 by RT PCR NEGATIVE NEGATIVE Final    Comment: (NOTE) SARS-CoV-2 target nucleic acids are NOT DETECTED. The SARS-CoV-2 RNA is generally detectable in upper respiratoy specimens during the acute phase of infection. The lowest concentration of SARS-CoV-2 viral copies this assay can detect is 131 copies/mL. A negative  result does not preclude SARS-Cov-2 infection and should not be used as the sole basis for treatment or other patient management decisions. A negative result may occur with  improper specimen collection/handling, submission of specimen other than nasopharyngeal swab, presence of viral mutation(s) within the areas targeted by this assay, and inadequate number of viral copies (<131 copies/mL). A negative result must be combined with clinical observations, patient history, and epidemiological information. The expected result is Negative. Fact Sheet for Patients:  PinkCheek.be Fact Sheet for Healthcare Providers:  GravelBags.it This test is not yet ap proved or cleared by the [place] FDA and  has been authorized for detection and/or diagnosis of SARS-CoV-2 by FDA  under an Emergency Use Authorization (EUA). This EUA will remain  in effect (meaning this test can be used) for the duration of the COVID-19 declaration under Section 564(b)(1) of the Act, 21 U.S.C. section 360bbb-3(b)(1), unless the authorization is terminated or revoked sooner.    Influenza A by PCR NEGATIVE NEGATIVE Final   Influenza B by PCR NEGATIVE NEGATIVE Final    Comment: (NOTE) The Xpert Xpress SARS-CoV-2/FLU/RSV assay is intended as an aid in  the diagnosis of influenza from Nasopharyngeal swab specimens and  should not be used as a sole basis for treatment. Nasal washings and  aspirates are unacceptable for Xpert Xpress SARS-CoV-2/FLU/RSV  testing. Fact Sheet for Patients: https://www.moore.com/ Fact Sheet for Healthcare Providers: https://www.young.biz/ This test is not yet approved or cleared by the [place] FDA and  has been authorized for detection and/or diagnosis of SARS-CoV-2 by  FDA under an Emergency Use Authorization (EUA). This EUA will remain  in effect (meaning this test can be used) for the  duration of the  Covid-19 declaration under Section 564(b)(1) of the Act, 21  U.S.C. section 360bbb-3(b)(1), unless the authorization is  terminated or revoked. Performed at [facility], [address] [place], [place] [address]      Labs: Basic Metabolic Panel: Recent Labs  Lab 02/22/20 1701 02/23/20 0513 02/24/20 0559 02/25/20 0528  NA 140 140 141 139  K 3.4* 4.0 3.8 3.5  CL 102 103 105 105  CO2 26 28 26 25   GLUCOSE 112* 125* 115* 86  BUN 6 6 8 8   CREATININE 0.86 0.69 0.50 0.58  CALCIUM 9.1 8.9 9.2 8.6*   Liver Function Tests: Recent Labs  Lab 02/22/20 1701  AST 14*  ALT 14  ALKPHOS 73  BILITOT 0.9  PROT 7.4  ALBUMIN 4.1   No results for input(s): LIPASE, AMYLASE in the last 168 hours. No results for input(s): AMMONIA in the last 168 hours. CBC: Recent Labs  Lab 02/22/20 1701 02/23/20 0513 02/24/20 0559 02/25/20 0528  WBC 19.1* 18.7* 18.9* 11.2*  HGB 12.5 12.2 11.3* 10.8*  HCT 37.8 37.5 33.7* 33.3*  MCV 94.0 92.8 91.6 93.3  PLT 352 273 318 306       Signed:  02/26/20 MD.  [facility] 02/25/2020, 11:15 AM

## 2020-02-28 LAB — CULTURE, BLOOD (ROUTINE X 2)
Culture: NO GROWTH
Culture: NO GROWTH
Special Requests: ADEQUATE
Special Requests: ADEQUATE

## 2020-04-19 ENCOUNTER — Other Ambulatory Visit: Payer: Self-pay | Admitting: Urology

## 2020-04-25 ENCOUNTER — Encounter (HOSPITAL_BASED_OUTPATIENT_CLINIC_OR_DEPARTMENT_OTHER): Payer: Self-pay | Admitting: Urology

## 2020-04-28 ENCOUNTER — Encounter (HOSPITAL_BASED_OUTPATIENT_CLINIC_OR_DEPARTMENT_OTHER): Payer: Self-pay | Admitting: Urology

## 2020-04-28 ENCOUNTER — Other Ambulatory Visit: Payer: Self-pay

## 2020-04-28 NOTE — Progress Notes (Signed)
Spoke w/ via phone for pre-op interview--- PT Lab needs dos--- Urine preg, Istat (gent ordered             Lab results------ no COVID test ------ 04-29-2020 @ 1105 Arrive at ------- 0945 NPO after ------ MN Medications to take morning of surgery ----- NONE Diabetic medication ----- n/a Patient Special Instructions ----- n/a Pre-Op special Istructions ----- n/a Patient verbalized understanding of instructions that were given at this phone interview. Patient denies shortness of breath, chest pain, fever, cough a this phone interview.

## 2020-04-29 ENCOUNTER — Other Ambulatory Visit (HOSPITAL_COMMUNITY): Payer: Medicaid Other

## 2020-05-02 ENCOUNTER — Other Ambulatory Visit (HOSPITAL_COMMUNITY)
Admission: RE | Admit: 2020-05-02 | Discharge: 2020-05-02 | Disposition: A | Discharge status: Home / Self Care | Payer: Medicaid Other | Source: Ambulatory Visit | Attending: Urology | Admitting: Urology

## 2020-05-02 DIAGNOSIS — Z20822 Contact with and (suspected) exposure to covid-19: Secondary | ICD-10-CM | POA: Insufficient documentation

## 2020-05-02 DIAGNOSIS — Z01812 Encounter for preprocedural laboratory examination: Secondary | ICD-10-CM | POA: Insufficient documentation

## 2020-05-02 LAB — SARS CORONAVIRUS 2 (TAT 6-24 HRS): SARS Coronavirus 2: NEGATIVE

## 2020-05-03 ENCOUNTER — Ambulatory Visit (HOSPITAL_BASED_OUTPATIENT_CLINIC_OR_DEPARTMENT_OTHER): Payer: Self-pay | Admitting: Certified Registered Nurse Anesthetist

## 2020-05-03 ENCOUNTER — Encounter (HOSPITAL_BASED_OUTPATIENT_CLINIC_OR_DEPARTMENT_OTHER): Payer: Self-pay | Admitting: Urology

## 2020-05-03 ENCOUNTER — Encounter (HOSPITAL_BASED_OUTPATIENT_CLINIC_OR_DEPARTMENT_OTHER)
Admission: RE | Disposition: A | Discharge status: Home / Self Care | Payer: Self-pay | Source: Home / Self Care | Attending: Urology

## 2020-05-03 ENCOUNTER — Ambulatory Visit (HOSPITAL_BASED_OUTPATIENT_CLINIC_OR_DEPARTMENT_OTHER)
Admission: RE | Admit: 2020-05-03 | Discharge: 2020-05-03 | Disposition: A | Discharge status: Home / Self Care | Payer: Self-pay | Attending: Urology | Admitting: Urology

## 2020-05-03 DIAGNOSIS — N202 Calculus of kidney with calculus of ureter: Secondary | ICD-10-CM | POA: Insufficient documentation

## 2020-05-03 DIAGNOSIS — Z87442 Personal history of urinary calculi: Secondary | ICD-10-CM | POA: Insufficient documentation

## 2020-05-03 HISTORY — PX: HOLMIUM LASER APPLICATION: SHX5852

## 2020-05-03 HISTORY — DX: Dysuria: R30.0

## 2020-05-03 HISTORY — PX: CYSTOSCOPY WITH RETROGRADE PYELOGRAM, URETEROSCOPY AND STENT PLACEMENT: SHX5789

## 2020-05-03 LAB — POCT I-STAT, CHEM 8
BUN: 9 mg/dL (ref 6–20)
Calcium, Ion: 1.27 mmol/L (ref 1.15–1.40)
Chloride: 100 mmol/L (ref 98–111)
Creatinine, Ser: 0.6 mg/dL (ref 0.44–1.00)
Glucose, Bld: 88 mg/dL (ref 70–99)
HCT: 35 % — ABNORMAL LOW (ref 36.0–46.0)
Hemoglobin: 11.9 g/dL — ABNORMAL LOW (ref 12.0–15.0)
Potassium: 3.9 mmol/L (ref 3.5–5.1)
Sodium: 139 mmol/L (ref 135–145)
TCO2: 27 mmol/L (ref 22–32)

## 2020-05-03 LAB — POCT PREGNANCY, URINE: Preg Test, Ur: NEGATIVE

## 2020-05-03 SURGERY — CYSTOURETEROSCOPY, WITH RETROGRADE PYELOGRAM AND STENT INSERTION
Anesthesia: General | Site: Ureter | Laterality: Bilateral

## 2020-05-03 MED ORDER — ONDANSETRON HCL 4 MG/2ML IJ SOLN
INTRAMUSCULAR | Status: DC | PRN
  Administered 2020-05-03: 4 mg via INTRAVENOUS

## 2020-05-03 MED ORDER — FENTANYL CITRATE (PF) 100 MCG/2ML IJ SOLN
INTRAMUSCULAR | Status: DC | PRN
  Administered 2020-05-03 (×2): 25 ug via INTRAVENOUS
  Administered 2020-05-03 (×3): 50 ug via INTRAVENOUS

## 2020-05-03 MED ORDER — GENTAMICIN SULFATE 40 MG/ML IJ SOLN
5.0000 mg/kg | INTRAVENOUS | Status: AC
  Administered 2020-05-03: 249.6 mg via INTRAVENOUS
  Filled 2020-05-03: qty 6.25

## 2020-05-03 MED ORDER — LACTATED RINGERS IV SOLN: INTRAVENOUS | Status: DC

## 2020-05-03 MED ORDER — LIDOCAINE 2% (20 MG/ML) 5 ML SYRINGE
INTRAMUSCULAR | Status: AC
  Filled 2020-05-03: qty 5

## 2020-05-03 MED ORDER — ONDANSETRON HCL 4 MG/2ML IJ SOLN
INTRAMUSCULAR | Status: AC
  Filled 2020-05-03: qty 2

## 2020-05-03 MED ORDER — MIDAZOLAM HCL 2 MG/2ML IJ SOLN
INTRAMUSCULAR | Status: AC
  Filled 2020-05-03: qty 2

## 2020-05-03 MED ORDER — IOHEXOL 300 MG/ML  SOLN
INTRAMUSCULAR | Status: DC | PRN
  Administered 2020-05-03: 35 mL via URETHRAL

## 2020-05-03 MED ORDER — MIDAZOLAM HCL 2 MG/2ML IJ SOLN
INTRAMUSCULAR | Status: DC | PRN
  Administered 2020-05-03: 2 mg via INTRAVENOUS

## 2020-05-03 MED ORDER — FENTANYL CITRATE (PF) 100 MCG/2ML IJ SOLN
INTRAMUSCULAR | Status: AC
  Filled 2020-05-03: qty 2

## 2020-05-03 MED ORDER — OXYCODONE-ACETAMINOPHEN 5-325 MG PO TABS: 1.0000 | ORAL_TABLET | Freq: Four times a day (QID) | ORAL | 0 refills | Status: AC | PRN

## 2020-05-03 MED ORDER — OXYCODONE-ACETAMINOPHEN 5-325 MG PO TABS
1.0000 | ORAL_TABLET | Freq: Once | ORAL | Status: AC
  Administered 2020-05-03: 1 via ORAL

## 2020-05-03 MED ORDER — CEPHALEXIN 500 MG PO CAPS: 500.0000 mg | ORAL_CAPSULE | Freq: Two times a day (BID) | ORAL | 0 refills | Status: DC

## 2020-05-03 MED ORDER — SODIUM CHLORIDE 0.9 % IR SOLN
Status: DC | PRN
  Administered 2020-05-03: 3000 mL via INTRAVESICAL

## 2020-05-03 MED ORDER — OXYCODONE-ACETAMINOPHEN 5-325 MG PO TABS
ORAL_TABLET | ORAL | Status: AC
  Filled 2020-05-03: qty 1

## 2020-05-03 MED ORDER — KETOROLAC TROMETHAMINE 10 MG PO TABS: 10.0000 mg | ORAL_TABLET | Freq: Three times a day (TID) | ORAL | 0 refills | Status: DC | PRN

## 2020-05-03 MED ORDER — DEXAMETHASONE SODIUM PHOSPHATE 10 MG/ML IJ SOLN
INTRAMUSCULAR | Status: AC
  Filled 2020-05-03: qty 1

## 2020-05-03 MED ORDER — FENTANYL CITRATE (PF) 100 MCG/2ML IJ SOLN: 25.0000 ug | INTRAMUSCULAR | Status: DC | PRN

## 2020-05-03 MED ORDER — DEXAMETHASONE SODIUM PHOSPHATE 10 MG/ML IJ SOLN
INTRAMUSCULAR | Status: DC | PRN
  Administered 2020-05-03: 10 mg via INTRAVENOUS

## 2020-05-03 MED ORDER — PROPOFOL 10 MG/ML IV BOLUS
INTRAVENOUS | Status: DC | PRN
  Administered 2020-05-03: 150 mg via INTRAVENOUS

## 2020-05-03 MED ORDER — OXYCODONE HCL 5 MG/5ML PO SOLN: 5.0000 mg | Freq: Once | ORAL | Status: DC | PRN

## 2020-05-03 MED ORDER — PROPOFOL 10 MG/ML IV BOLUS
INTRAVENOUS | Status: AC
  Filled 2020-05-03: qty 20

## 2020-05-03 MED ORDER — OXYCODONE HCL 5 MG PO TABS: 5.0000 mg | ORAL_TABLET | Freq: Once | ORAL | Status: DC | PRN

## 2020-05-03 MED ORDER — LIDOCAINE 2% (20 MG/ML) 5 ML SYRINGE
INTRAMUSCULAR | Status: DC | PRN
  Administered 2020-05-03: 60 mg via INTRAVENOUS

## 2020-05-03 MED ORDER — ONDANSETRON HCL 4 MG/2ML IJ SOLN: 4.0000 mg | Freq: Once | INTRAMUSCULAR | Status: DC | PRN

## 2020-05-03 SURGICAL SUPPLY — 23 items
BAG DRAIN URO-CYSTO SKYTR STRL (DRAIN) ×2 IMPLANT
BASKET LASER NITINOL 1.9FR (BASKET) ×2 IMPLANT
CATH INTERMIT  6FR 70CM (CATHETERS) ×2 IMPLANT
CLOTH BEACON ORANGE TIMEOUT ST (SAFETY) ×2 IMPLANT
FIBER LASER FLEXIVA 365 (UROLOGICAL SUPPLIES) IMPLANT
FIBER LASER TRAC TIP (UROLOGICAL SUPPLIES) ×2 IMPLANT
GLOVE BIO SURGEON STRL SZ7.5 (GLOVE) ×2 IMPLANT
GOWN STRL REUS W/TWL LRG LVL3 (GOWN DISPOSABLE) ×2 IMPLANT
GUIDEWIRE ANG ZIPWIRE 038X150 (WIRE) ×4 IMPLANT
GUIDEWIRE STR DUAL SENSOR (WIRE) ×4 IMPLANT
IV NS 1000ML (IV SOLUTION) ×2
IV NS 1000ML BAXH (IV SOLUTION) ×1 IMPLANT
IV NS IRRIG 3000ML ARTHROMATIC (IV SOLUTION) ×2 IMPLANT
KIT TURNOVER CYSTO (KITS) ×2 IMPLANT
MANIFOLD NEPTUNE II (INSTRUMENTS) ×2 IMPLANT
NS IRRIG 500ML POUR BTL (IV SOLUTION) ×2 IMPLANT
SHEATH URETERAL 12FRX28CM (UROLOGICAL SUPPLIES) ×2 IMPLANT
STENT POLARIS 5FRX22 (STENTS) ×4 IMPLANT
SYR 10ML LL (SYRINGE) ×2 IMPLANT
TRAY CYSTO PACK (CUSTOM PROCEDURE TRAY) ×2 IMPLANT
TUBE CONNECTING 12X1/4 (SUCTIONS) ×2 IMPLANT
TUBE FEEDING 8FR 16IN STR KANG (MISCELLANEOUS) ×2 IMPLANT
TUBING UROLOGY SET (TUBING) ×2 IMPLANT

## 2020-05-03 NOTE — Anesthesia Preprocedure Evaluation (Signed)
Anesthesia Evaluation  Patient identified by MRN, date of birth, ID band Patient awake    Reviewed: Allergy & Precautions, NPO status , Patient's Chart, lab work & pertinent test results  Airway Mallampati: I  TM Distance: >3 FB Neck ROM: Full    Dental   Pulmonary    Pulmonary exam normal        Cardiovascular Normal cardiovascular exam     Neuro/Psych    GI/Hepatic   Endo/Other    Renal/GU      Musculoskeletal   Abdominal   Peds  Hematology   Anesthesia Other Findings   Reproductive/Obstetrics                             Anesthesia Physical Anesthesia Plan  ASA: II  Anesthesia Plan: General   Post-op Pain Management:    Induction: Intravenous  PONV Risk Score and Plan: 3 and Midazolam, Ondansetron and Dexamethasone  Airway Management Planned: LMA  Additional Equipment:   Intra-op Plan:   Post-operative Plan: Extubation in OR  Informed Consent: I have reviewed the patients History and Physical, chart, labs and discussed the procedure including the risks, benefits and alternatives for the proposed anesthesia with the patient or authorized representative who has indicated his/her understanding and acceptance.       Plan Discussed with: CRNA and Surgeon  Anesthesia Plan Comments:         Anesthesia Quick Evaluation

## 2020-05-03 NOTE — Brief Op Note (Signed)
05/03/2020  12:54 PM  PATIENT:  Kendra Morgan  24 y.o. female  PRE-OPERATIVE DIAGNOSIS:  BILATERAL RENAL AND RIGHT URETERAL STONES  POST-OPERATIVE DIAGNOSIS:  BILATERAL RENAL AND RIGHT URETERAL STONES  PROCEDURE:  Procedure(s) with comments: CYSTOSCOPY WITH RETROGRADE PYELOGRAM, URETEROSCOPY AND STENT PLACEMENT (Bilateral) - 75 MINS HOLMIUM LASER APPLICATION (Bilateral)  SURGEON:  Surgeon(s) and Role:    Sebastian Ache, MD - Primary  PHYSICIAN ASSISTANT:   ASSISTANTS: none   ANESTHESIA:   general  EBL:  mnimal   BLOOD ADMINISTERED:none  DRAINS: none   LOCAL MEDICATIONS USED:  NONE  SPECIMEN:  Source of Specimen:  bilateral renal / ureteral stone fragments  DISPOSITION OF SPECIMEN:  [facility] for compositional analysis  COUNTS:  YES  TOURNIQUET:  * No tourniquets in log *  DICTATION: .Other Dictation: Dictation Number  [id number]  PLAN OF CARE: Discharge to home after PACU  PATIENT DISPOSITION:  PACU - hemodynamically stable.   Delay start of Pharmacological VTE agent (>24hrs) due to surgical blood loss or risk of bleeding: yes

## 2020-05-03 NOTE — Op Note (Signed)
NAME: Kendra Morgan, Kendra Morgan MEDICAL RECORD ZO:[id number] ACCOUNT [id number] DATE OF BIRTH:April 12, 1996 FACILITY: [facility] LOCATION: [facility] PHYSICIAN:Merril Nagy Berneice Heinrich, MD  OPERATIVE REPORT  DATE OF PROCEDURE:  05/03/2020  SURGEON:  Sebastian Ache MD  PREOPERATIVE DIAGNOSES:   1.  Bilateral ureteral and renal stones. 2.  History of bacteriuria.  PROCEDURE: 1.  Cystoscopy, bilateral retrograde pyelograms, interpretation. 2.  Bilateral ureteroscopy with laser lithotripsy. 3.  Insertion of bilateral ureteral stents, 5 x 22 Polaris with tether.  ESTIMATED BLOOD LOSS:  Nil.  COMPLICATIONS:  None.  SPECIMEN:  Bilateral renal and ureteral stone fragments for composition analysis.  FINDINGS: 1.  Right mid ureteral stone x2, the more proximal larger stone quite impacted. 2.  Bilateral renal stones, mostly papillary tip, approximately 3 mm x2 mm each side. 3.  Successful placement of bilateral ureteral stents, proximal end in the renal pelvis, distal end in urinary bladder. 4.  Successful removal of all accessible stone fragments larger than one-third mm following laser lithotripsy and basket extraction.  INDICATIONS:  The patient is a variably compliant, but very pleasant 24 year old young woman with history of recurrent urolithiasis.  She underwent surgery several years ago and did not comply with surveillance imaging.  She represented with colic in  late April of this year.  She had some infectious parameters at the time.  Therefore, she underwent right ureteral stent by a colleague of mine on call.  She then represented to the office, discussed definitive stone management.  She also had  contralateral renal stone fragments.  Given her young, fertile age, I strongly recommended a path towards completely stone free, bilateral ureteroscopy, and she wished to proceed.  Informed consent was obtained and placed in the medical record.  DESCRIPTION OF PROCEDURE:  The patient being identified, the  procedure being bilateral ureteroscopic stone manipulation was confirmed.  Procedure timeout was performed.  Intravenous antibiotics administered.  General anesthesia introduced.  The patient  was placed into a low lithotomy position, sterile field was created, prepping and draping the patient's vagina, introitus and proximal thighs using iodine.  Cystourethroscopy was then performed using 21-French rigid cystoscope with offset lens.   Inspection of the urinary bladder revealed no calcifications or papillary lesions.  The distal end of the right ureteral stent was seen in situ.  It was moderately encrusted.  It was brought out in its entirety, inspected, set aside for discard.  It was  intact.  The right ureteral orifice was then cannulated with a 6-French renal catheter and right retrograde pyelogram was obtained.  Right retrograde pyelogram demonstrated a single right ureter, single system right kidney.  There were 2 filling defects in the midureter consistent with known stones.  A 0.03 ZIPwire was advanced to lower pole and set aside as a safety wire.  Next, left  retrograde pyelogram was obtained.  Left retrograde pyelogram demonstrated a single left ureter with single system left kidney.  No filling defects or narrowing noted.  A separate 0.03 ZIPwire was advanced to lower pole and set aside as a safety wire.  An 8-French feeding tube was placed  in the urinary bladder, pressure released and semirigid ureteroscopy was performed of the distal four-fifths of the left ureter alongside a separate sensor working wire.  Next, semirigid ureteroscopy was performed of the right ureter alongside a separate  sensor working wire.  In the distal third of the ureter, there was a stone encountered, approximately 4 mm.  It was too large for simple basketing.  Holmium laser energy applied 70,  setting of 0.2 joules and 20 Hz, and the stone was fragmented into  approximately 3 smaller pieces that were then amenable to  basketing with the Escape basket.  They were removed and set aside for composition analysis.  Semirigid ureteroscopy to the level of the midureter revealed a much larger dominant calcification.   This was quite impacted.  It was also much too large for simple basketing.  Holmium laser energy was then applied to this stone using settings of 0.2 joules and 20 Hz, and using fragmenting technique.  This was fragmented into approximately 6 smaller  pieces.  They were then sequentially grasped with the Escape basket, removed and set aside for composition analysis.  Semirigid ureteroscopy performed of the remainder of the distal right ureter.  No calcifications or mucosal abnormalities were noted.   The semirigid scope was then exchanged for a 12/14 short length ureteral access sheath to the level of the proximal ureter and flexible digital ureteroscopy performed of the proximal right ureter, systematic inspection of the right kidney, including all  calices x3.  There were several small papillary tip calcifications that were amenable to simple basketing, approximately 2 of these at 3 mm each.  They were removed.  The access sheath was then removed under continuous vision, no mucosal abnormalities  were found.  Next, the access sheath was placed over the left sensor working wire to the level of the proximal left ureter and flexible digital ureteroscopy performed of the proximal left ureter and left kidney.  There were 2 dominant calcifications,  papillary tip in the lower pole.  These were amenable to simple basketing.  They were removed and set aside for composition analysis.  Access sheath was then removed on the left side.  No mucosal abnormalities were found.  Given the bilateral procedure,  it was felt that brief interval stenting with tethered stents would be warranted.  As such, a new 5 x 22 Polaris-type stents were placed over bilateral remaining safety wires using fluoroscopic guidance.  Good proximal and  distal planes were noted.   Tether was left in place and tucked per vagina and the procedure was terminated.  The patient tolerated the procedure well.  No immediate complications.  The patient was taken to postanesthesia care in stable condition.  Plan for discharge home.  VN/NUANCE  D:05/03/2020 T:05/03/2020 JOB:[id number]

## 2020-05-03 NOTE — Anesthesia Procedure Notes (Signed)
Procedure Name: LMA Insertion Date/Time: 05/03/2020 11:51 AM Performed by: [place], Davis Vannatter C, CRNA Pre-anesthesia Checklist: Patient identified, Emergency Drugs available, Suction available and Patient being monitored Patient Re-evaluated:Patient Re-evaluated prior to induction Oxygen Delivery Method: Circle system utilized Preoxygenation: Pre-oxygenation with 100% oxygen Induction Type: IV induction Ventilation: Mask ventilation without difficulty LMA: LMA inserted LMA Size: 4.0 Number of attempts: 1 Airway Equipment and Method: Bite block Placement Confirmation: positive ETCO2 Tube secured with: Tape Dental Injury: Teeth and Oropharynx as per pre-operative assessment

## 2020-05-03 NOTE — Discharge Instructions (Signed)
1 - You may have urinary urgency (bladder spasms) and bloody urine on / off with stent in place. This is normal. ° °2 - Remove tethered stent on Friday morning at home by pulling on string, then blue-white plastic tubing, and discarding. Office is open Friday if any problems arise.  ° °3 - Call MD or go to ER for fever >102, severe pain / nausea / vomiting not relieved by medications, or acute change in medical status ° ° ° ° °Post Anesthesia Home Care Instructions ° °Activity: °Get plenty of rest for the remainder of the day. A responsible individual must stay with you for 24 hours following the procedure.  °For the next 24 hours, DO NOT: °-Drive a car °-Operate machinery °-Drink alcoholic beverages °-Take any medication unless instructed by your physician °-Make any legal decisions or sign important papers. ° °Meals: °Start with liquid foods such as gelatin or soup. Progress to regular foods as tolerated. Avoid greasy, spicy, heavy foods. If nausea and/or vomiting occur, drink only clear liquids until the nausea and/or vomiting subsides. Call your physician if vomiting continues. ° °Special Instructions/Symptoms: °Your throat may feel dry or sore from the anesthesia or the breathing tube placed in your throat during surgery. If this causes discomfort, gargle with warm salt water. The discomfort should disappear within 24 hours. ° ° °   ° ° ° ° ° ° °

## 2020-05-03 NOTE — Transfer of Care (Signed)
Immediate Anesthesia Transfer of Care Note  Patient: Kendra Morgan  Procedure(s) Performed: CYSTOSCOPY WITH RETROGRADE PYELOGRAM, URETEROSCOPY AND STENT PLACEMENT (Bilateral Ureter) HOLMIUM LASER APPLICATION (Bilateral Ureter)  Patient Location: PACU  Anesthesia Type:General  Level of Consciousness: awake and drowsy  Airway & Oxygen Therapy: Patient Spontanous Breathing and Patient connected to face mask oxygen  Post-op Assessment: Report given to RN and Post -op Vital signs reviewed and stable  Post vital signs: Reviewed and stable  Last Vitals:  Vitals Value Taken Time  BP 111/82 05/03/20 1301  Temp    Pulse 110 05/03/20 1305  Resp 23 05/03/20 1305  SpO2 100 % 05/03/20 1305  Vitals shown include unvalidated device data.  Last Pain:  Vitals:   05/03/20 1021  TempSrc: Oral  PainSc: 7       Patients Stated Pain Goal: 6 (05/03/20 1021)  Complications: No complications documented.

## 2020-05-03 NOTE — H&P (Signed)
Kendra Morgan is an 24 y.o. female.    Chief Complaint:   HPI:   1 - Recurrent Urolithiasis -  07/2018 -  s/p bilateral ureteroscopy to stone free  02/2020 - Rt stent for 23mm and 50mm ureteral stones, bilateral 67mm renal stones.     2 - Recurrent Urosepsis  -  07/2018 - treated in setting of stones  02/2020 -treated in setting of stones. UCX e. coli sens keflex, rocephin, gent, nitro.     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 NO h/o refractory HTN or hypokalemia. Stable on CT 2021.     4 - Medical Stone Disease -  Eval 2019: BMP, PTH, Urate - pending; Composition - 80% CaOx / 20% CaPO4; 24 Hr Urines - pending.     Today "Kendra Morgan" is seen to proceed with BILATERAL ureteroscopic stone manipulation with goal of stone free. No interval fevers. She has been on keflex pre-op as instructed according to most recent cultures.   Past Medical History:  Diagnosis Date  . Bilateral ureteral calculi   . Dysuria   . Urgency of urination   . Wears contact lenses     Past Surgical History:  Procedure Laterality Date  . CYSTOSCOPY W/ URETERAL STENT PLACEMENT Bilateral 07/14/2018   Procedure: CYSTOSCOPY WITH RETROGRADE PYELOGRAM/URETERAL STENT PLACEMENT;  Surgeon: Alexis Frock, MD;  Location: [facility];  Service: Urology;  Laterality: Bilateral;  . CYSTOSCOPY W/ URETERAL STENT PLACEMENT Right 02/23/2020   Procedure: CYSTOSCOPY WITH RETROGRADE PYELOGRAM AND RIGHT URETERAL STENT PLACEMENT;  Surgeon: Lucas Mallow, MD;  Location: [facility];  Service: Urology;  Laterality: Right;  . CYSTOSCOPY WITH RETROGRADE PYELOGRAM, URETEROSCOPY AND STENT PLACEMENT Bilateral 08/05/2018   Procedure: CYSTOSCOPY WITH RETROGRADE PYELOGRAM, URETEROSCOPY , BASKET EXTRACTION OF STONES AND STENT EXCHANGE;  Surgeon: Alexis Frock, MD;  Location: [facility];  Service: Urology;  Laterality: Bilateral;  . WISDOM TOOTH EXTRACTION  2017    Family History  Problem Relation  Age of Onset  . Urinary tract infection Mother    Social History:  reports that she has never smoked. She has never used smokeless tobacco. She reports that she does not drink alcohol and does not use drugs.  Allergies: No Known Allergies  No medications prior to admission.    Results for orders placed or performed during the hospital encounter of 05/02/20 (from the past 48 hour(s))  SARS CORONAVIRUS 2 (TAT 6-24 HRS) Nasopharyngeal Nasopharyngeal Swab     Status: None   Collection Time: 05/02/20 10:03 AM   Specimen: Nasopharyngeal Swab  Result Value Ref Range   SARS Coronavirus 2 NEGATIVE NEGATIVE    Comment: (NOTE) SARS-CoV-2 target nucleic acids are NOT DETECTED.  The SARS-CoV-2 RNA is generally detectable in upper and lower respiratory specimens during the acute phase of infection. Negative results do not preclude SARS-CoV-2 infection, do not rule out co-infections with other pathogens, and should not be used as the sole basis for treatment or other patient management decisions. Negative results must be combined with clinical observations, patient history, and epidemiological information. The expected result is Negative.  Fact Sheet for Patients: SugarRoll.be  Fact Sheet for Healthcare Providers: https://www.woods-mathews.com/  This test is not yet approved or cleared by the [place] FDA and  has been authorized for detection and/or diagnosis of SARS-CoV-2 by FDA under an Emergency Use Authorization (EUA). This EUA will remain  in effect (meaning this test can be used) for the duration  of the COVID-19 declaration under Se ction 564(b)(1) of the Act, 21 U.S.C. section 360bbb-3(b)(1), unless the authorization is terminated or revoked sooner.  Performed at [facility], [address], [place], [place] [address]    No results found.  Review of Systems  Constitutional: Negative for chills and fever.  HENT: Negative.    Respiratory: Negative.   Cardiovascular: Negative.   Gastrointestinal: Negative.   Genitourinary: Negative.   Musculoskeletal: Negative.   Neurological: Negative.   Hematological: Negative.   Psychiatric/Behavioral: Negative.     Height 5' (1.524 m), weight 48.1 kg, last menstrual period 04/02/2020. Physical Exam  Vitals reviewed. HENT:  Head: Normocephalic.  Nose: Nose normal.  Mouth/Throat: Mucous membranes are moist.  Cardiovascular: Normal rate and normal pulses.  Respiratory: Effort normal.  Genitourinary:    Genitourinary Comments: No CVAT at present.    Musculoskeletal:     Cervical back: Normal range of motion.  Neurological: She is alert.  Skin: Skin is warm.  Psychiatric: Mood normal.     Assessment/Plan  Proceed as planned with BILATERAL ureteroscopic stone manipulation with goal of stone free. Risks, benefits, alternatives, expected peri-op course discussed previously and reiterated today.   Sebastian Ache, MD 05/03/2020, 8:03 AM

## 2020-05-04 ENCOUNTER — Encounter (HOSPITAL_BASED_OUTPATIENT_CLINIC_OR_DEPARTMENT_OTHER): Payer: Self-pay | Admitting: Urology

## 2020-05-04 NOTE — Anesthesia Postprocedure Evaluation (Signed)
Anesthesia Post Note  Patient: Kendra Morgan  Procedure(s) Performed: CYSTOSCOPY WITH RETROGRADE PYELOGRAM, URETEROSCOPY AND STENT PLACEMENT (Bilateral Ureter) HOLMIUM LASER APPLICATION (Bilateral Ureter)     Patient location during evaluation: PACU Anesthesia Type: General Level of consciousness: awake and alert Pain management: pain level controlled Vital Signs Assessment: post-procedure vital signs reviewed and stable Respiratory status: spontaneous breathing, nonlabored ventilation and respiratory function stable Cardiovascular status: blood pressure returned to baseline and stable Postop Assessment: no apparent nausea or vomiting Anesthetic complications: no   No complications documented.  Last Vitals:  Vitals:   05/03/20 1330 05/03/20 1439  BP: 121/76 118/76  Pulse: 84 81  Resp: 17 16  Temp:  36.8 C  SpO2: 100% 100%    Last Pain:  Vitals:   05/03/20 1439  TempSrc:   PainSc: 4                  Lucretia Kern

## 2021-05-01 IMAGING — CT CT RENAL STONE PROTOCOL
2 of 4 series · 15 of 46 positions shown, 17 images · non-contrast
Comparison: 07/14/2018

CLINICAL DATA: Right-sided abdominal pain with history of renal
calculi

EXAM:
CT ABDOMEN AND PELVIS WITHOUT CONTRAST
TECHNIQUE: Multidetector CT imaging of the abdomen and pelvis was performed
following the standard protocol without IV contrast.

[Series 2: axial st · axial · 0.62mm/px · z∈[+882,+1272]mm · 12 of 88 slices shown, 14 images]
[im 5/88  soft-tissue]
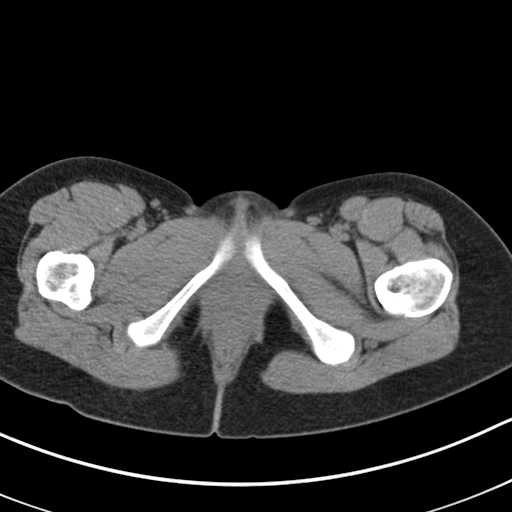
[im 5/88  bone]
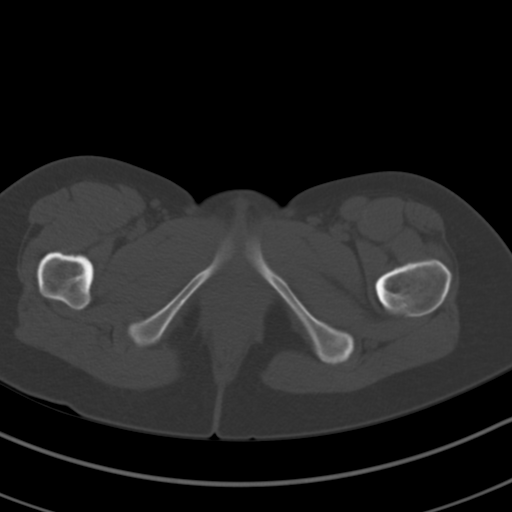
[im 14/88  soft-tissue]
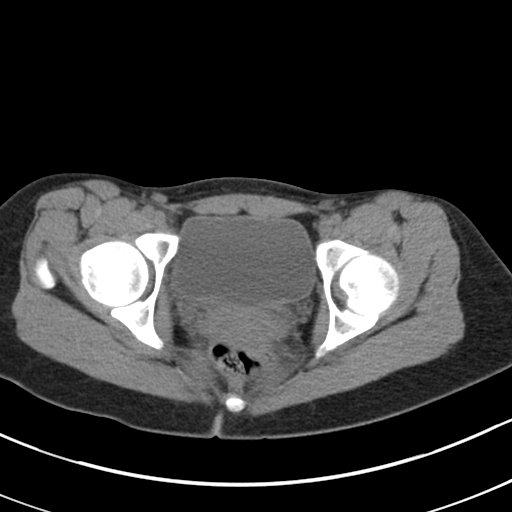
[im 19/88  soft-tissue]
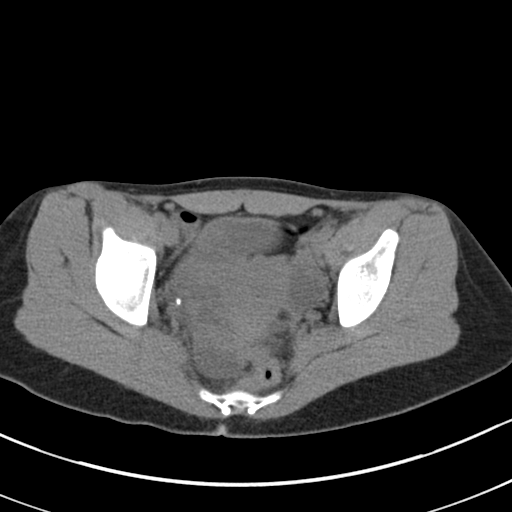
[im 28/88  soft-tissue]
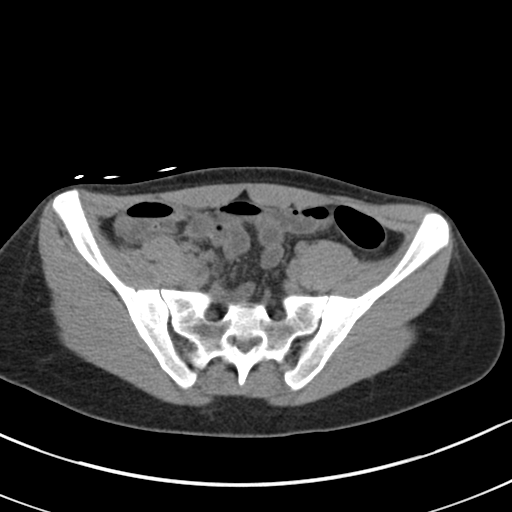
[im 33/88  soft-tissue]
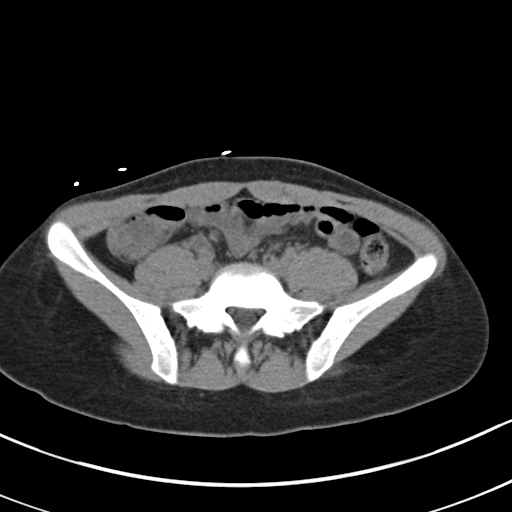
[im 42/88  soft-tissue]
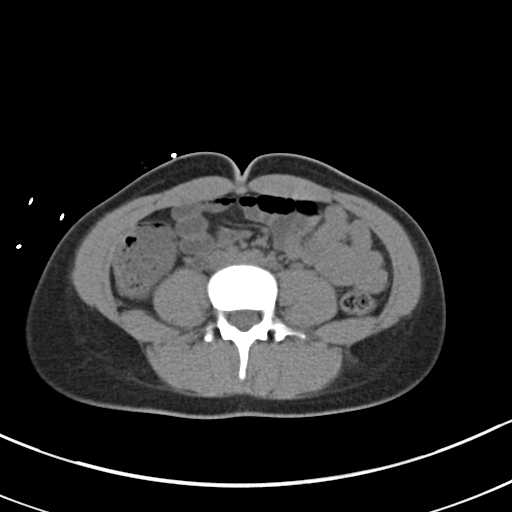
[im 46/88  soft-tissue]
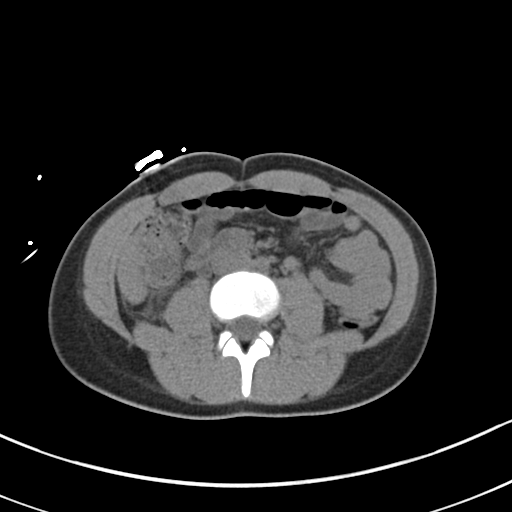
[im 55/88  soft-tissue]
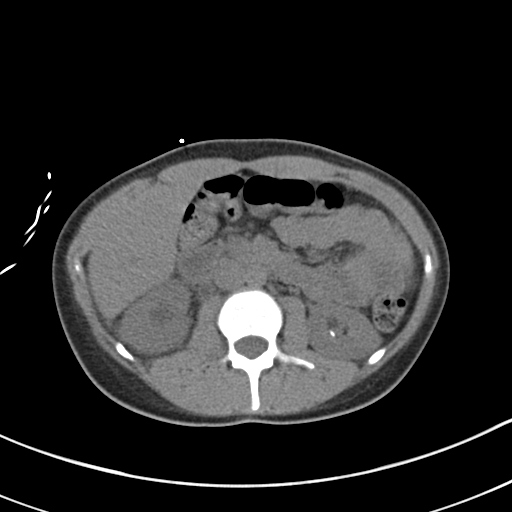
[im 60/88  soft-tissue]
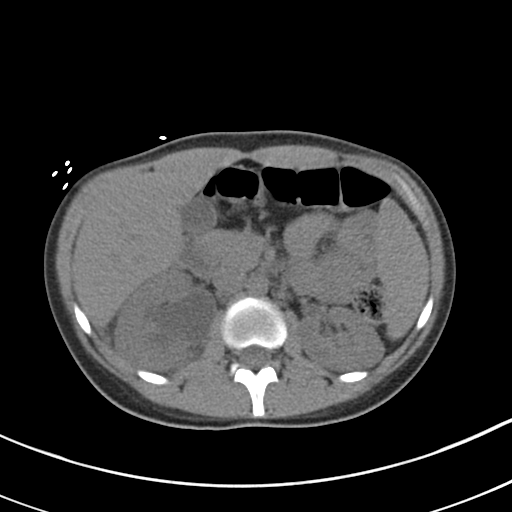
[im 60/88  bone]
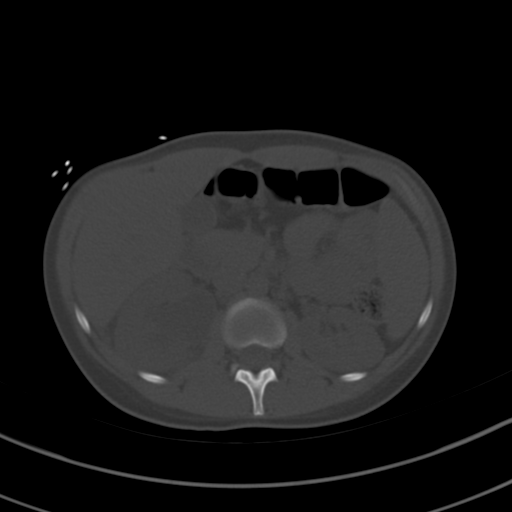
[im 69/88  soft-tissue]
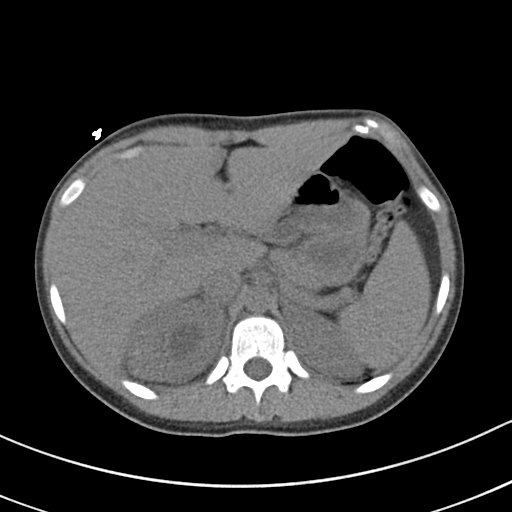
[im 74/88  soft-tissue]
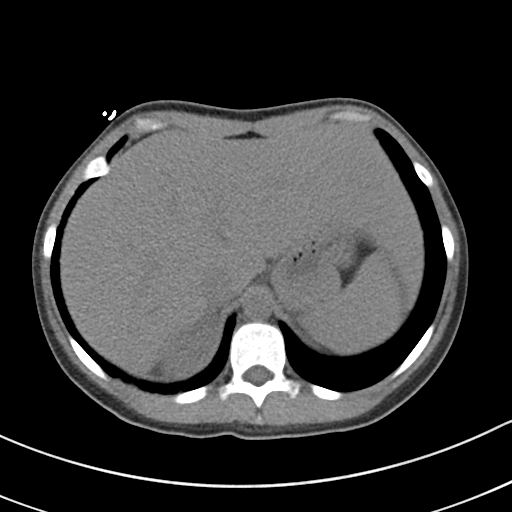
[im 83/88  soft-tissue]
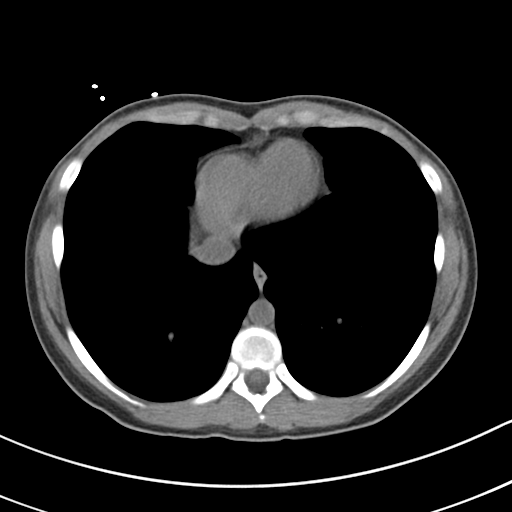

[Series 5: coronal · coronal · 0.66mm/px · 3 of 128 slices shown]
[im 43/128  soft-tissue]
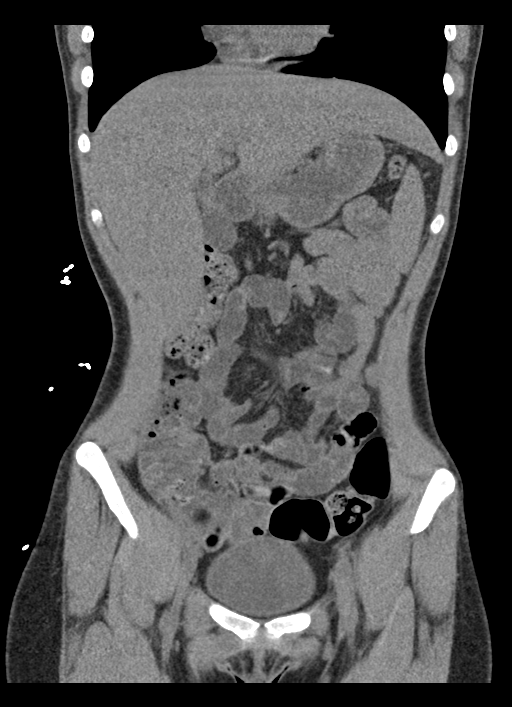
[im 57/128  soft-tissue]
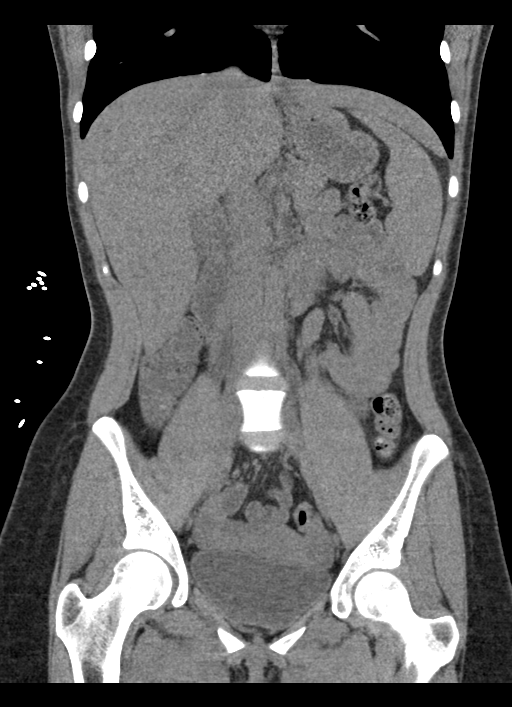
[im 71/128  soft-tissue]
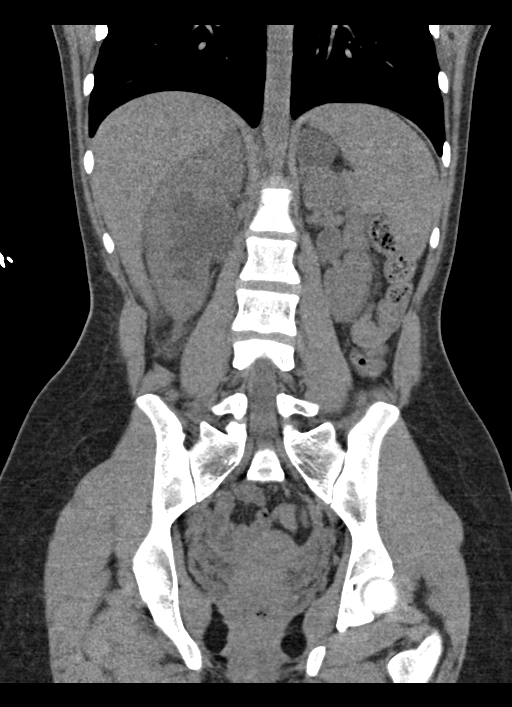

[15 of 46 positions shown; findings below may reference images not displayed]

FINDINGS: Lower chest: No acute abnormality.

Hepatobiliary: No focal liver abnormality is seen. No gallstones,
gallbladder wall thickening, or biliary dilatation.

Pancreas: Unremarkable. No pancreatic ductal dilatation or
surrounding inflammatory changes.

Spleen: Normal in size without focal abnormality.

Adrenals/Urinary Tract: Adrenal glands are well visualized. Left
adrenal adenoma is again seen and stable. The left kidney
demonstrates a few small renal calculi. The largest of these
measures 4 mm. This is slightly enlarged when compare with the prior
examination. Hydronephrosis is noted on the right with hydroureter
extending to a 9 mm obstructing stone in the right ureter. A second
calculus is noted at the right UVJ which measures approximately 6 mm
also consist right ureteral stone. Small nonobstructing lower pole
renal stone is noted on the right as well. The bladder is
decompressed.

Stomach/Bowel: The appendix is not well visualized. No inflammatory
changes are seen to suggest appendicitis. No small bowel or gastric
abnormality is noted.

Vascular/Lymphatic: No significant vascular findings are present. No
enlarged abdominal or pelvic lymph nodes.

Reproductive: Uterus and bilateral adnexa are unremarkable.

Other: No abdominal wall hernia or abnormality. No abdominopelvic
ascites.

Musculoskeletal: No acute or significant osseous findings.
IMPRESSION: Two right ureteral stones as described with significant
hydronephrosis. Possibility of underlying pyelonephritis would
deserve consideration as well although evaluation is limited due to
lack of IV contrast.

Bilateral nonobstructing stones increased when compared with the
prior exam.

Stable left adrenal adenoma.

No other focal abnormality is seen.

## 2021-07-18 ENCOUNTER — Emergency Department (HOSPITAL_COMMUNITY): Payer: Medicaid Other

## 2021-07-18 ENCOUNTER — Emergency Department (HOSPITAL_COMMUNITY)
Admission: EM | Admit: 2021-07-18 | Discharge: 2021-07-18 | Disposition: A | Discharge status: Home / Self Care | Payer: Medicaid Other | Attending: Emergency Medicine | Admitting: Emergency Medicine

## 2021-07-18 ENCOUNTER — Other Ambulatory Visit: Payer: Self-pay

## 2021-07-18 ENCOUNTER — Encounter (HOSPITAL_COMMUNITY): Payer: Self-pay

## 2021-07-18 DIAGNOSIS — R112 Nausea with vomiting, unspecified: Secondary | ICD-10-CM | POA: Insufficient documentation

## 2021-07-18 DIAGNOSIS — R300 Dysuria: Secondary | ICD-10-CM | POA: Insufficient documentation

## 2021-07-18 DIAGNOSIS — R197 Diarrhea, unspecified: Secondary | ICD-10-CM | POA: Diagnosis not present

## 2021-07-18 DIAGNOSIS — Z960 Presence of urogenital implants: Secondary | ICD-10-CM

## 2021-07-18 DIAGNOSIS — N390 Urinary tract infection, site not specified: Secondary | ICD-10-CM

## 2021-07-18 DIAGNOSIS — M545 Low back pain, unspecified: Secondary | ICD-10-CM | POA: Diagnosis not present

## 2021-07-18 DIAGNOSIS — K5900 Constipation, unspecified: Secondary | ICD-10-CM | POA: Insufficient documentation

## 2021-07-18 LAB — CBC WITH DIFFERENTIAL/PLATELET
Abs Immature Granulocytes: 0.05 10*3/uL (ref 0.00–0.07)
Basophils Absolute: 0 10*3/uL (ref 0.0–0.1)
Basophils Relative: 0 %
Eosinophils Absolute: 0 10*3/uL (ref 0.0–0.5)
Eosinophils Relative: 0 %
HCT: 39.7 % (ref 36.0–46.0)
Hemoglobin: 12.9 g/dL (ref 12.0–15.0)
Immature Granulocytes: 0 %
Lymphocytes Relative: 8 %
Lymphs Abs: 1.1 10*3/uL (ref 0.7–4.0)
MCH: 27.9 pg (ref 26.0–34.0)
MCHC: 32.5 g/dL (ref 30.0–36.0)
MCV: 85.9 fL (ref 80.0–100.0)
Monocytes Absolute: 1.4 10*3/uL — ABNORMAL HIGH (ref 0.1–1.0)
Monocytes Relative: 11 %
Neutro Abs: 10.4 10*3/uL — ABNORMAL HIGH (ref 1.7–7.7)
Neutrophils Relative %: 81 %
Platelets: 345 10*3/uL (ref 150–400)
RBC: 4.62 MIL/uL (ref 3.87–5.11)
RDW: 14.2 % (ref 11.5–15.5)
WBC: 13 10*3/uL — ABNORMAL HIGH (ref 4.0–10.5)
nRBC: 0 % (ref 0.0–0.2)

## 2021-07-18 LAB — COMPREHENSIVE METABOLIC PANEL
ALT: 12 U/L (ref 0–44)
AST: 16 U/L (ref 15–41)
Albumin: 4.5 g/dL (ref 3.5–5.0)
Alkaline Phosphatase: 89 U/L (ref 38–126)
Anion gap: 7 (ref 5–15)
BUN: 9 mg/dL (ref 6–20)
CO2: 26 mmol/L (ref 22–32)
Calcium: 9.7 mg/dL (ref 8.9–10.3)
Chloride: 106 mmol/L (ref 98–111)
Creatinine, Ser: 0.92 mg/dL (ref 0.44–1.00)
GFR, Estimated: >60 mL/min (ref >60)
Glucose, Bld: 98 mg/dL (ref 70–99)
Potassium: 3.7 mmol/L (ref 3.5–5.1)
Sodium: 139 mmol/L (ref 135–145)
Total Bilirubin: 0.7 mg/dL (ref 0.3–1.2)
Total Protein: 8.2 g/dL — ABNORMAL HIGH (ref 6.5–8.1)

## 2021-07-18 LAB — URINALYSIS, MICROSCOPIC (REFLEX)
Squamous Epithelial / HPF: NONE SEEN (ref 0–5)
WBC, UA: >50 WBC/hpf (ref 0–5)

## 2021-07-18 LAB — I-STAT BETA HCG BLOOD, ED (MC, WL, AP ONLY): I-stat hCG, quantitative: <5 m[IU]/mL (ref <5)

## 2021-07-18 LAB — URINALYSIS, ROUTINE W REFLEX MICROSCOPIC
Bilirubin Urine: NEGATIVE
Glucose, UA: NEGATIVE mg/dL
Ketones, ur: NEGATIVE mg/dL
Nitrite: POSITIVE — AB
Protein, ur: >300 mg/dL — AB
Specific Gravity, Urine: 1.025 (ref 1.005–1.030)
pH: 6 (ref 5.0–8.0)

## 2021-07-18 MED ORDER — CEPHALEXIN 500 MG PO CAPS: 500.0000 mg | ORAL_CAPSULE | Freq: Three times a day (TID) | ORAL | 0 refills | Status: DC

## 2021-07-18 MED ORDER — SODIUM CHLORIDE 0.9 % IV SOLN
1.0000 g | Freq: Once | INTRAVENOUS | Status: AC
  Administered 2021-07-18: 1 g via INTRAVENOUS
  Filled 2021-07-18: qty 10

## 2021-07-18 NOTE — ED Provider Notes (Signed)
Received signout from previous provider, please see her note for complete H&P.  This is a 25 year old female with history recurrent kidney stones who presents with left flank pain and urinary discomfort since yesterday.  And she reports she had 2 ureteral stents placed in 2020 and she was able to remove 1 at home but was never able to remove the second 1.  She believes she may still have the stents in place.  Today she has urinary discomfort as well as pain to her left flank with associated nausea.  Labs remarkable for elevated white count of 13.0, and UA with nitrite positive and finding consistent with a urinary tract infection.  1 view abdominal x-ray demonstrate a left-sided double-J ureteral stents in good position.  The calcification appears to encasing the stents in the collecting system and in the upper left ureter.  No right-sided renal or ureteral calculi.  No bladder calculi.  Patient reports she is unable to visualize the ureteral string to remove it.  I will reach out and notify on-call urology for recommendation.  Patient given Rocephin IV.  6:53 PM Appreciate consultation from on-call urologist, Dr. Retta Diones who had an opportunity to review patient's chart and x-ray.  Will discharge home with Keflex, urine culture sent, patient will follow-up closely with urology for outpatient care.  Anticipate outpatient OR removal of the stent.  Return precaution given.  BP 110/70   Pulse 82   Temp 99.1 F (37.3 C) (Oral)   Resp 16   SpO2 100%   Results for orders placed or performed during the hospital encounter of 07/18/21  CBC with Differential  Result Value Ref Range   WBC 13.0 (H) 4.0 - 10.5 K/uL   RBC 4.62 3.87 - 5.11 MIL/uL   Hemoglobin 12.9 12.0 - 15.0 g/dL   HCT 27.2 53.6 - 64.4 %   MCV 85.9 80.0 - 100.0 fL   MCH 27.9 26.0 - 34.0 pg   MCHC 32.5 30.0 - 36.0 g/dL   RDW 03.4 74.2 - 59.5 %   Platelets 345 150 - 400 K/uL   nRBC 0.0 0.0 - 0.2 %   Neutrophils Relative % 81 %   Neutro  Abs 10.4 (H) 1.7 - 7.7 K/uL   Lymphocytes Relative 8 %   Lymphs Abs 1.1 0.7 - 4.0 K/uL   Monocytes Relative 11 %   Monocytes Absolute 1.4 (H) 0.1 - 1.0 K/uL   Eosinophils Relative 0 %   Eosinophils Absolute 0.0 0.0 - 0.5 K/uL   Basophils Relative 0 %   Basophils Absolute 0.0 0.0 - 0.1 K/uL   Immature Granulocytes 0 %   Abs Immature Granulocytes 0.05 0.00 - 0.07 K/uL  Comprehensive metabolic panel  Result Value Ref Range   Sodium 139 135 - 145 mmol/L   Potassium 3.7 3.5 - 5.1 mmol/L   Chloride 106 98 - 111 mmol/L   CO2 26 22 - 32 mmol/L   Glucose, Bld 98 70 - 99 mg/dL   BUN 9 6 - 20 mg/dL   Creatinine, Ser 6.38 0.44 - 1.00 mg/dL   Calcium 9.7 8.9 - 75.6 mg/dL   Total Protein 8.2 (H) 6.5 - 8.1 g/dL   Albumin 4.5 3.5 - 5.0 g/dL   AST 16 15 - 41 U/L   ALT 12 0 - 44 U/L   Alkaline Phosphatase 89 38 - 126 U/L   Total Bilirubin 0.7 0.3 - 1.2 mg/dL   GFR, Estimated >43 >32 mL/min   Anion gap 7 5 -  15  Urinalysis, Routine w reflex microscopic Urine, Clean Catch  Result Value Ref Range   Color, Urine YELLOW YELLOW   APPearance TURBID (A) CLEAR   Specific Gravity, Urine 1.025 1.005 - 1.030   pH 6.0 5.0 - 8.0   Glucose, UA NEGATIVE NEGATIVE mg/dL   Hgb urine dipstick LARGE (A) NEGATIVE   Bilirubin Urine NEGATIVE NEGATIVE   Ketones, ur NEGATIVE NEGATIVE mg/dL   Protein, ur >616 (A) NEGATIVE mg/dL   Nitrite POSITIVE (A) NEGATIVE   Leukocytes,Ua MODERATE (A) NEGATIVE  Urinalysis, Microscopic (reflex)  Result Value Ref Range   RBC / HPF 21-50 0 - 5 RBC/hpf   WBC, UA >50 0 - 5 WBC/hpf   Bacteria, UA RARE (A) NONE SEEN   Squamous Epithelial / LPF NONE SEEN 0 - 5   Mucus PRESENT   I-Stat beta hCG blood, ED  Result Value Ref Range   I-stat hCG, quantitative <5.0 <5 mIU/mL   Comment 3           DG Abdomen 1 View  Result Date: 07/18/2021 CLINICAL DATA:  Left flank pain and nausea since last evening. History of bilateral double-J ureteral stents. EXAM: ABDOMEN - 1 VIEW COMPARISON:   05/03/2020 FINDINGS: A left-sided double-J ureteral stent is noted. There appears to be calcification around the stent in the renal pelvis in the upper left ureter. Right-sided renal calculi are identified. No bladder calculi. The bowel gas pattern is unremarkable. The bony structures are intact. IMPRESSION: 1. Left-sided double-J ureteral stent in good position. 2. Calcification appears to encase the stent in the collecting system and in the upper left ureter. 3. No right-sided renal or ureteral calculi.  No bladder. Electronically Signed   By: Rudie Meyer M.D.   On: 07/18/2021 12:20      Fayrene Helper, PA-C 07/18/21 1900    Jacalyn Lefevre, MD 07/18/21 2038

## 2021-07-18 NOTE — ED Provider Notes (Signed)
Emergency Medicine Provider Triage Evaluation Note  Kendra Morgan , a 25 y.o. female  was evaluated in triage.  Pt complains of pain in the kidneys/low back, onset yesterday, L>R, described as throbbing/stabbing/aching. Does not radiate, pain is worse with movement- difficulty sleeping last night/couldn't get comfortable. Similar pain in the past, had sepsis from UTI in 2019. 2020 here had stents placed, removed at home but only 1 came out, called urology office but never heard back from office regarding if there were 2 stents or 1. Suspects she still has a stent in place. Vomiting x 1 this am. Able to tolerate POs since. Also diarrhea, constipation.   Review of Systems  Positive: Vomiting, back pain, dysuria Negative: fever  Physical Exam  BP 117/72 (BP Location: Left Arm)   Pulse 92   Temp 99.1 F (37.3 C) (Oral)   Resp 16   SpO2 98%  Gen:   Awake, no distress   Resp:  Normal effort  MSK:   Moves extremities without difficulty  Other:  No CVA tenderness, does have TTP right and left paraspinous  Medical Decision Making  Medically screening exam initiated at 10:28 AM.  Appropriate orders placed.  Kendra Morgan was informed that the remainder of the evaluation will be completed by another provider, this initial triage assessment does not replace that evaluation, and the importance of remaining in the ED until their evaluation is complete.     Jeannie Fend, PA-C 07/18/21 1032    Mancel Bale, MD 07/18/21 [phone]

## 2021-07-18 NOTE — ED Provider Notes (Signed)
[facility]-EMERGENCY DEPT Provider Note   CSN: [id number] Arrival date & time: 07/18/21  4098     History Chief Complaint  Patient presents with   Flank Pain    Kendra Morgan is a 25 y.o. female.  25 year old female with complaint of pain in the kidneys/low back, onset yesterday, L>R, described as throbbing/stabbing/aching. Does not radiate, pain is worse with movement- difficulty sleeping last night/couldn't get comfortable. Similar pain in the past, had sepsis from UTI in 2019. 2020 here had stents placed, removed at home but only 1 came out, called urology office but never heard back from office regarding if there were 2 stents or 1. Suspects she still has a stent in place. Vomiting x 1 this am. Able to tolerate POs since. Also diarrhea, constipation.       Past Medical History:  Diagnosis Date   Bilateral ureteral calculi    Dysuria    Urgency of urination    Wears contact lenses     Patient Active Problem List   Diagnosis Date Noted   UTI (urinary tract infection) 02/23/2020   Pyohydronephrosis 02/22/2020   Hydronephrosis of right kidney 02/22/2020   Sepsis secondary to UTI (HCC) 07/16/2018   Nephrolithiasis 07/16/2018   Allergic reaction 07/16/2018   Severe sepsis (HCC) 07/16/2018   Bilateral hydronephrosis 07/14/2018   Leukocytosis 07/14/2018   Pyelonephritis 07/13/2018    Past Surgical History:  Procedure Laterality Date   CYSTOSCOPY W/ URETERAL STENT PLACEMENT Bilateral 07/14/2018   Procedure: CYSTOSCOPY WITH RETROGRADE PYELOGRAM/URETERAL STENT PLACEMENT;  Surgeon: Sebastian Ache, MD;  Location: [facility];  Service: Urology;  Laterality: Bilateral;   CYSTOSCOPY W/ URETERAL STENT PLACEMENT Right 02/23/2020   Procedure: CYSTOSCOPY WITH RETROGRADE PYELOGRAM AND RIGHT URETERAL STENT PLACEMENT;  Surgeon: Crista Elliot, MD;  Location: [facility];  Service: Urology;  Laterality: Right;   CYSTOSCOPY WITH RETROGRADE PYELOGRAM, URETEROSCOPY AND STENT  PLACEMENT Bilateral 08/05/2018   Procedure: CYSTOSCOPY WITH RETROGRADE PYELOGRAM, URETEROSCOPY , BASKET EXTRACTION OF STONES AND STENT EXCHANGE;  Surgeon: Sebastian Ache, MD;  Location: [facility];  Service: Urology;  Laterality: Bilateral;   CYSTOSCOPY WITH RETROGRADE PYELOGRAM, URETEROSCOPY AND STENT PLACEMENT Bilateral 05/03/2020   Procedure: CYSTOSCOPY WITH RETROGRADE PYELOGRAM, URETEROSCOPY AND STENT PLACEMENT;  Surgeon: Sebastian Ache, MD;  Location: [facility];  Service: Urology;  Laterality: Bilateral;  75 MINS   HOLMIUM LASER APPLICATION Bilateral 05/03/2020   Procedure: HOLMIUM LASER APPLICATION;  Surgeon: Sebastian Ache, MD;  Location: [facility];  Service: Urology;  Laterality: Bilateral;   WISDOM TOOTH EXTRACTION  2017     OB History   No obstetric history on file.     Family History  Problem Relation Age of Onset   Urinary tract infection Mother     Social History   Tobacco Use   Smoking status: Never   Smokeless tobacco: Never  Vaping Use   Vaping Use: Never used  Substance Use Topics   Alcohol use: Never   Drug use: Never    Home Medications Prior to Admission medications   Medication Sig Start Date End Date Taking? Authorizing Provider  cephALEXin (KEFLEX) 500 MG capsule Take 1 capsule (500 mg total) by mouth 2 (two) times daily. X 3 days to prevent infection with tethered stents. 05/03/20   Sebastian Ache, MD  EPINEPHrine (EPIPEN 2-PAK) 0.3 mg/0.3 mL IJ SOAJ injection Inject 0.3 mLs (0.3 mg total) into the muscle once for 1 dose. 07/16/18 04/28/20  Calvert Cantor, MD  ketorolac (TORADOL) 10 MG tablet Take 1 tablet (10 mg total) by mouth every 8 (eight) hours as needed for moderate pain. Or stent discomfort post-operatively 05/03/20   Sebastian Ache, MD    Allergies    Patient has no known allergies.  Review of Systems   Review of Systems  Constitutional:  Negative for chills, diaphoresis and fever.   Gastrointestinal:  Positive for constipation, diarrhea, nausea and vomiting. Negative for abdominal pain.  Genitourinary:  Positive for dysuria.  Musculoskeletal:  Positive for back pain. Negative for myalgias.  Skin:  Negative for rash and wound.  Allergic/Immunologic: Negative for immunocompromised state.  Neurological:  Negative for weakness.  Hematological:  Negative for adenopathy.  Psychiatric/Behavioral:  Negative for confusion.   All other systems reviewed and are negative.  Physical Exam Updated Vital Signs BP 103/73 (BP Location: Right Arm)   Pulse 75   Temp 99.1 F (37.3 C) (Oral)   Resp 15   SpO2 100%   Physical Exam Vitals and nursing note reviewed.  Constitutional:      General: She is not in acute distress.    Appearance: She is well-developed. She is not diaphoretic.  HENT:     Head: Normocephalic and atraumatic.  Cardiovascular:     Rate and Rhythm: Normal rate and regular rhythm.     Heart sounds: Normal heart sounds.  Pulmonary:     Effort: Pulmonary effort is normal.     Breath sounds: Normal breath sounds.  Abdominal:     General: There is no distension.     Palpations: Abdomen is soft.     Tenderness: There is no abdominal tenderness. There is no right CVA tenderness, left CVA tenderness or guarding.  Musculoskeletal:        General: Tenderness present. No swelling or deformity.       Back:  Skin:    General: Skin is warm and dry.     Findings: No erythema or rash.  Neurological:     Mental Status: She is alert and oriented to person, place, and time.  Psychiatric:        Behavior: Behavior normal.    ED Results / Procedures / Treatments   Labs (all labs ordered are listed, but only abnormal results are displayed) Labs Reviewed  CBC WITH DIFFERENTIAL/PLATELET - Abnormal; Notable for the following components:      Result Value   WBC 13.0 (*)    Neutro Abs 10.4 (*)    Monocytes Absolute 1.4 (*)    All other components within normal  limits  COMPREHENSIVE METABOLIC PANEL - Abnormal; Notable for the following components:   Total Protein 8.2 (*)    All other components within normal limits  URINALYSIS, ROUTINE W REFLEX MICROSCOPIC  I-STAT BETA HCG BLOOD, ED ([facility], [facility], [facility] ONLY)    EKG None  Radiology DG Abdomen 1 View  Result Date: 07/18/2021 CLINICAL DATA:  Left flank pain and nausea since last evening. History of bilateral double-J ureteral stents. EXAM: ABDOMEN - 1 VIEW COMPARISON:  05/03/2020 FINDINGS: A left-sided double-J ureteral stent is noted. There appears to be calcification around the stent in the renal pelvis in the upper left ureter. Right-sided renal calculi are identified. No bladder calculi. The bowel gas pattern is unremarkable. The bony structures are intact. IMPRESSION: 1. Left-sided double-J ureteral stent in good position. 2. Calcification appears to encase the stent in the collecting system and in the upper left ureter. 3. No right-sided renal or ureteral calculi.  No bladder.  Electronically Signed   By: Rudie Meyer M.D.   On: 07/18/2021 12:20    Procedures Procedures   Medications Ordered in ED Medications - No data to display  ED Course  I have reviewed the triage vital signs and the nursing notes.  Pertinent labs & imaging results that were available during my care of the patient were reviewed by me and considered in my medical decision making (see chart for details).  Clinical Course as of 07/18/21 1459  Wed Jul 18, 2021  2919 25 year old female presents with complaint of left worse than right low back pain with concern for retained ureteral stent placed 2 years ago.  On exam, no CVA tenderness, no abdominal tenderness, is found to have left and right paraspinous low back tenderness. X-ray shows left ureteral stent. CBC with elevated white blood cell count 13,000.  Patient is afebrile.  CMP with normal renal function.  hCG negative. Patient is pending urinalysis, machine downtime has delayed  this result. Care may be signed out pending urinalysis, if positive, may need antibiotics otherwise if negative, may be referred to urology for follow-up. [LM]    Clinical Course User Index [LM] Alden Hipp   MDM Rules/Calculators/A&P                           Final Clinical Impression(s) / ED Diagnoses Final diagnoses:  Acute left-sided low back pain without sciatica    Rx / DC Orders ED Discharge Orders     None        Jeannie Fend, PA-C 07/18/21 1500    Mancel Bale, MD 07/18/21 1654

## 2021-07-18 NOTE — ED Triage Notes (Signed)
Pt reports left flank pain that began last night. Pt reports having a stent in left kidney for over a year that was supposed to be removed but never followed up. Pt reports taking AZO for burning with urination for over 3 months.

## 2021-07-18 NOTE — Discharge Instructions (Addendum)
You have a retained stent along with a urinary tract infection.  Take antibiotic as prescribed.  Call and Follow up with urology, referral given. Return to the ER for fevers, vomiting, worsening or concerning symptoms.

## 2021-07-20 LAB — URINE CULTURE

## 2021-08-09 ENCOUNTER — Other Ambulatory Visit: Payer: Self-pay | Admitting: Urology

## 2021-08-14 ENCOUNTER — Encounter (HOSPITAL_BASED_OUTPATIENT_CLINIC_OR_DEPARTMENT_OTHER): Payer: Self-pay | Admitting: Urology

## 2021-08-14 ENCOUNTER — Other Ambulatory Visit: Payer: Self-pay

## 2021-08-14 NOTE — Progress Notes (Signed)
Spoke w/ via phone for pre-op interview--- pt Lab needs dos----  urine preg             Lab results------ no COVID test -----patient states asymptomatic no test needed Arrive at ------- 1115 on 08-17-2021 NPO after MN NO Solid Food.  Clear liquids from MN until--- 1015 Med rec completed Medications to take morning of surgery ----- none Diabetic medication -----n/a Patient instructed no nail polish to be worn day of surgery Patient instructed to bring photo id and insurance card day of surgery Patient aware to have Driver (ride ) / caregiver for 24 hours after surgery --mother, Kendra Morgan Patient Special Instructions ----- n/a Pre-Op special Istructions ----- n/a Patient verbalized understanding of instructions that were given at this phone interview. Patient denies shortness of breath, chest pain, fever, cough at this phone interview.

## 2021-08-17 ENCOUNTER — Encounter (HOSPITAL_BASED_OUTPATIENT_CLINIC_OR_DEPARTMENT_OTHER): Payer: Self-pay | Admitting: Urology

## 2021-08-17 ENCOUNTER — Encounter (HOSPITAL_BASED_OUTPATIENT_CLINIC_OR_DEPARTMENT_OTHER)
Admission: RE | Disposition: A | Discharge status: Home / Self Care | Payer: Self-pay | Source: Home / Self Care | Attending: Urology

## 2021-08-17 ENCOUNTER — Other Ambulatory Visit: Payer: Self-pay

## 2021-08-17 ENCOUNTER — Ambulatory Visit (HOSPITAL_BASED_OUTPATIENT_CLINIC_OR_DEPARTMENT_OTHER): Payer: Medicaid Other | Admitting: Anesthesiology

## 2021-08-17 ENCOUNTER — Ambulatory Visit (HOSPITAL_BASED_OUTPATIENT_CLINIC_OR_DEPARTMENT_OTHER)
Admission: RE | Admit: 2021-08-17 | Discharge: 2021-08-17 | Disposition: A | Discharge status: Home / Self Care | Payer: Medicaid Other | Attending: Urology | Admitting: Urology

## 2021-08-17 DIAGNOSIS — Z87442 Personal history of urinary calculi: Secondary | ICD-10-CM | POA: Diagnosis not present

## 2021-08-17 DIAGNOSIS — N1330 Unspecified hydronephrosis: Secondary | ICD-10-CM | POA: Insufficient documentation

## 2021-08-17 DIAGNOSIS — T8389XA Other specified complication of genitourinary prosthetic devices, implants and grafts, initial encounter: Secondary | ICD-10-CM | POA: Diagnosis not present

## 2021-08-17 DIAGNOSIS — Z841 Family history of disorders of kidney and ureter: Secondary | ICD-10-CM | POA: Insufficient documentation

## 2021-08-17 DIAGNOSIS — R3915 Urgency of urination: Secondary | ICD-10-CM | POA: Insufficient documentation

## 2021-08-17 DIAGNOSIS — X58XXXA Exposure to other specified factors, initial encounter: Secondary | ICD-10-CM | POA: Insufficient documentation

## 2021-08-17 HISTORY — PX: HOLMIUM LASER APPLICATION: SHX5852

## 2021-08-17 HISTORY — DX: Personal history of urinary calculi: Z87.442

## 2021-08-17 HISTORY — DX: Presence of urogenital implants: Z96.0

## 2021-08-17 HISTORY — PX: CYSTOSCOPY WITH RETROGRADE PYELOGRAM, URETEROSCOPY AND STENT PLACEMENT: SHX5789

## 2021-08-17 HISTORY — DX: Urge incontinence: N39.41

## 2021-08-17 LAB — POCT I-STAT, CHEM 8
BUN: 10 mg/dL (ref 6–20)
Calcium, Ion: 1.17 mmol/L (ref 1.15–1.40)
Chloride: 103 mmol/L (ref 98–111)
Creatinine, Ser: 0.8 mg/dL (ref 0.44–1.00)
Glucose, Bld: 92 mg/dL (ref 70–99)
HCT: 41 % (ref 36.0–46.0)
Hemoglobin: 13.9 g/dL (ref 12.0–15.0)
Potassium: 4.1 mmol/L (ref 3.5–5.1)
Sodium: 139 mmol/L (ref 135–145)
TCO2: 24 mmol/L (ref 22–32)

## 2021-08-17 LAB — POCT PREGNANCY, URINE: Preg Test, Ur: NEGATIVE

## 2021-08-17 SURGERY — CYSTOURETEROSCOPY, WITH RETROGRADE PYELOGRAM AND STENT INSERTION
Anesthesia: General | Laterality: Left

## 2021-08-17 MED ORDER — OXYCODONE HCL 5 MG/5ML PO SOLN: 5.0000 mg | Freq: Once | ORAL | Status: DC | PRN

## 2021-08-17 MED ORDER — PROPOFOL 10 MG/ML IV BOLUS
INTRAVENOUS | Status: DC | PRN
Start: 2021-08-17 — End: 2021-08-17
  Administered 2021-08-17: 150 mg via INTRAVENOUS
  Administered 2021-08-17: 50 mg via INTRAVENOUS

## 2021-08-17 MED ORDER — MIDAZOLAM HCL 2 MG/2ML IJ SOLN
INTRAMUSCULAR | Status: AC
  Filled 2021-08-17: qty 2

## 2021-08-17 MED ORDER — DEXAMETHASONE SODIUM PHOSPHATE 10 MG/ML IJ SOLN
INTRAMUSCULAR | Status: AC
  Filled 2021-08-17: qty 1

## 2021-08-17 MED ORDER — 0.9 % SODIUM CHLORIDE (POUR BTL) OPTIME
TOPICAL | Status: DC | PRN
  Administered 2021-08-17: 500 mL

## 2021-08-17 MED ORDER — FENTANYL CITRATE (PF) 100 MCG/2ML IJ SOLN
INTRAMUSCULAR | Status: AC
  Filled 2021-08-17: qty 2

## 2021-08-17 MED ORDER — ONDANSETRON HCL 4 MG/2ML IJ SOLN
INTRAMUSCULAR | Status: AC
  Filled 2021-08-17: qty 2

## 2021-08-17 MED ORDER — DROPERIDOL 2.5 MG/ML IJ SOLN
0.6250 mg | Freq: Once | INTRAMUSCULAR | Status: DC | PRN
Start: 2021-08-17 — End: 2021-08-17

## 2021-08-17 MED ORDER — LIDOCAINE HCL (CARDIAC) PF 100 MG/5ML IV SOSY
PREFILLED_SYRINGE | INTRAVENOUS | Status: DC | PRN
  Administered 2021-08-17: 60 mg via INTRAVENOUS

## 2021-08-17 MED ORDER — ONDANSETRON HCL 4 MG/2ML IJ SOLN
INTRAMUSCULAR | Status: DC | PRN
  Administered 2021-08-17: 4 mg via INTRAVENOUS

## 2021-08-17 MED ORDER — SCOPOLAMINE 1 MG/3DAYS TD PT72
MEDICATED_PATCH | TRANSDERMAL | Status: AC
  Filled 2021-08-17: qty 1

## 2021-08-17 MED ORDER — GENTAMICIN SULFATE 40 MG/ML IJ SOLN
5.0000 mg/kg | INTRAVENOUS | Status: AC
  Administered 2021-08-17: 247.2 mg via INTRAVENOUS
  Filled 2021-08-17: qty 6.25

## 2021-08-17 MED ORDER — OXYCODONE-ACETAMINOPHEN 5-325 MG PO TABS: 1.0000 | ORAL_TABLET | Freq: Four times a day (QID) | ORAL | 0 refills | Status: DC | PRN

## 2021-08-17 MED ORDER — PROMETHAZINE HCL 25 MG/ML IJ SOLN: 6.2500 mg | INTRAMUSCULAR | Status: DC | PRN

## 2021-08-17 MED ORDER — PROPOFOL 10 MG/ML IV BOLUS
INTRAVENOUS | Status: AC
  Filled 2021-08-17: qty 20

## 2021-08-17 MED ORDER — MIDAZOLAM HCL 5 MG/5ML IJ SOLN
INTRAMUSCULAR | Status: DC | PRN
  Administered 2021-08-17 (×2): 1 mg via INTRAVENOUS

## 2021-08-17 MED ORDER — FENTANYL CITRATE (PF) 100 MCG/2ML IJ SOLN: 25.0000 ug | INTRAMUSCULAR | Status: DC | PRN

## 2021-08-17 MED ORDER — KETOROLAC TROMETHAMINE 10 MG PO TABS: 10.0000 mg | ORAL_TABLET | Freq: Three times a day (TID) | ORAL | 0 refills | Status: DC | PRN

## 2021-08-17 MED ORDER — ACETAMINOPHEN 500 MG PO TABS
1000.0000 mg | ORAL_TABLET | Freq: Once | ORAL | Status: AC
  Administered 2021-08-17: 1000 mg via ORAL

## 2021-08-17 MED ORDER — DEXAMETHASONE SODIUM PHOSPHATE 4 MG/ML IJ SOLN
INTRAMUSCULAR | Status: DC | PRN
  Administered 2021-08-17: 5 mg via INTRAVENOUS

## 2021-08-17 MED ORDER — ACETAMINOPHEN 500 MG PO TABS
ORAL_TABLET | ORAL | Status: AC
  Filled 2021-08-17: qty 2

## 2021-08-17 MED ORDER — FENTANYL CITRATE (PF) 100 MCG/2ML IJ SOLN
INTRAMUSCULAR | Status: DC | PRN
  Administered 2021-08-17 (×4): 25 ug via INTRAVENOUS
  Administered 2021-08-17 (×2): 50 ug via INTRAVENOUS

## 2021-08-17 MED ORDER — SENNOSIDES-DOCUSATE SODIUM 8.6-50 MG PO TABS: 1.0000 | ORAL_TABLET | Freq: Two times a day (BID) | ORAL | 0 refills | Status: AC

## 2021-08-17 MED ORDER — OXYCODONE HCL 5 MG PO TABS: 5.0000 mg | ORAL_TABLET | Freq: Once | ORAL | Status: DC | PRN

## 2021-08-17 MED ORDER — SCOPOLAMINE 1 MG/3DAYS TD PT72
1.0000 | MEDICATED_PATCH | TRANSDERMAL | Status: DC
  Administered 2021-08-17: 1.5 mg via TRANSDERMAL

## 2021-08-17 MED ORDER — IOHEXOL 300 MG/ML  SOLN
INTRAMUSCULAR | Status: DC | PRN
  Administered 2021-08-17: 5 mL via URETHRAL

## 2021-08-17 MED ORDER — LIDOCAINE 2% (20 MG/ML) 5 ML SYRINGE
INTRAMUSCULAR | Status: AC
  Filled 2021-08-17: qty 5

## 2021-08-17 MED ORDER — LACTATED RINGERS IV SOLN: INTRAVENOUS | Status: DC

## 2021-08-17 MED ORDER — SODIUM CHLORIDE 0.9 % IR SOLN
Status: DC | PRN
  Administered 2021-08-17: 3000 mL

## 2021-08-17 SURGICAL SUPPLY — 23 items
BAG DRAIN URO-CYSTO SKYTR STRL (DRAIN) ×2 IMPLANT
BAG DRN UROCATH (DRAIN) ×1
BASKET LASER NITINOL 1.9FR (BASKET) ×2 IMPLANT
BSKT STON RTRVL 120 1.9FR (BASKET) ×1
CATH INTERMIT  6FR 70CM (CATHETERS) ×2 IMPLANT
CLOTH BEACON ORANGE TIMEOUT ST (SAFETY) ×2 IMPLANT
COVER DOME SNAP 22 D (MISCELLANEOUS) ×2 IMPLANT
GLOVE SURG ENC MOIS LTX SZ7.5 (GLOVE) ×2 IMPLANT
GLOVE SURG UNDER POLY LF SZ7 (GLOVE) ×8 IMPLANT
GOWN STRL REUS W/TWL LRG LVL3 (GOWN DISPOSABLE) ×8 IMPLANT
GUIDEWIRE STR DUAL SENSOR (WIRE) ×2 IMPLANT
IV NS IRRIG 3000ML ARTHROMATIC (IV SOLUTION) ×2 IMPLANT
KIT TURNOVER CYSTO (KITS) ×2 IMPLANT
MANIFOLD NEPTUNE II (INSTRUMENTS) ×2 IMPLANT
NS IRRIG 500ML POUR BTL (IV SOLUTION) ×2 IMPLANT
PACK CYSTO (CUSTOM PROCEDURE TRAY) ×2 IMPLANT
STENT PERCUFLEX 4.8FRX24 (STENTS) ×2 IMPLANT
SYR 10ML LL (SYRINGE) ×2 IMPLANT
TRACTIP FLEXIVA PULS ID 200XHI (Laser) ×1 IMPLANT
TRACTIP FLEXIVA PULSE ID 200 (Laser) ×2
TUBE CONNECTING 12X1/4 (SUCTIONS) ×2 IMPLANT
TUBE FEEDING 8FR 16IN STR KANG (MISCELLANEOUS) ×2 IMPLANT
TUBING UROLOGY SET (TUBING) ×2 IMPLANT

## 2021-08-17 NOTE — Transfer of Care (Signed)
Immediate Anesthesia Transfer of Care Note  Patient: Kendra Morgan  Procedure(s) Performed: Procedure(s) (LRB): CYSTOSCOPY WITH RETROGRADE PYELOGRAM, URETEROSCOPY STONE EXTRACTION AND EXCHANGE. FIRST STAGE. (Left) HOLMIUM LASER APPLICATION (Left)  Patient Location: PACU  Anesthesia Type: General  Level of Consciousness: awake, sedated, patient cooperative and responds to stimulation  Airway & Oxygen Therapy: Patient Spontanous Breathing and Patient connected to [place] 02 and soft FM   Post-op Assessment: Report given to PACU RN, Post -op Vital signs reviewed and stable and Patient moving all extremities  Post vital signs: Reviewed and stable  Complications: No apparent anesthesia complications

## 2021-08-17 NOTE — Anesthesia Procedure Notes (Signed)
Procedure Name: LMA Insertion Date/Time: 08/17/2021 12:53 PM Performed by: Jessica Priest, CRNA Pre-anesthesia Checklist: Patient identified, Emergency Drugs available, Suction available, Patient being monitored and Timeout performed Patient Re-evaluated:Patient Re-evaluated prior to induction Oxygen Delivery Method: Circle system utilized Preoxygenation: Pre-oxygenation with 100% oxygen Induction Type: IV induction Ventilation: Mask ventilation without difficulty LMA: LMA inserted LMA Size: 3.0 Number of attempts: 1 Airway Equipment and Method: Bite block Placement Confirmation: positive ETCO2, breath sounds checked- equal and bilateral and CO2 detector Tube secured with: Tape Dental Injury: Teeth and Oropharynx as per pre-operative assessment

## 2021-08-17 NOTE — Brief Op Note (Signed)
08/17/2021  1:52 PM  PATIENT:  Kendra Morgan  25 y.o. female  PRE-OPERATIVE DIAGNOSIS:  RETAINED, LEFT URETERAL STENT  POST-OPERATIVE DIAGNOSIS:  RETAINED, LEFT URETERAL STENT  PROCEDURE:  Procedure(s) with comments: CYSTOSCOPY WITH RETROGRADE PYELOGRAM, URETEROSCOPY STONE EXTRACTION AND EXCHANGE. FIRST STAGE. (Left) - 75 MINS HOLMIUM LASER APPLICATION (Left)  SURGEON:  Surgeon(s) and Role:    * Sebastian Ache, MD - Primary  PHYSICIAN ASSISTANT:   ASSISTANTS: none   ANESTHESIA:   general  EBL:  0 mL   BLOOD ADMINISTERED:none  DRAINS: none   LOCAL MEDICATIONS USED:  NONE  SPECIMEN:  Source of Specimen:  distal 1/4 of retained prior stent  DISPOSITION OF SPECIMEN:   discard  COUNTS:  YES  TOURNIQUET:  * No tourniquets in log *  DICTATION: .Other Dictation: Dictation Number   [id number]  PLAN OF CARE: Discharge to home after PACU  PATIENT DISPOSITION:  PACU - hemodynamically stable.   Delay start of Pharmacological VTE agent (>24hrs) due to surgical blood loss or risk of bleeding: yes

## 2021-08-17 NOTE — Discharge Instructions (Addendum)
No acetaminophen/Tylenol until after 6:30pm today if needed.      1 - You may have urinary urgency (bladder spasms) and bloody urine on / off with stent in place. This is normal.  2 - Call MD or go to ER for fever >102, severe pain / nausea / vomiting not relieved by medications, or acute change in medical status   CYSTOSCOPY HOME CARE INSTRUCTIONS  Activity: Rest for the remainder of the day.  Do not drive or operate equipment today.  You may resume normal activities in one to two days as instructed by your physician.   Meals: Drink plenty of liquids and eat light foods such as gelatin or soup this evening.  You may return to a normal meal plan tomorrow.  Return to Work: You may return to work in one to two days or as instructed by your physician.  Special Instructions / Symptoms: Call your physician if any of these symptoms occur:   -persistent or heavy bleeding  -bleeding which continues after first few urination  -large blood clots that are difficult to pass  -urine stream diminishes or stops completely  -fever equal to or higher than 101 degrees Farenheit.  -cloudy urine with a strong, foul odor  -severe pain  Females should always wipe from front to back after elimination.  You may feel some burning pain when you urinate.  This should disappear with time.  Applying moist heat to the lower abdomen or a hot tub bath may help relieve the pain. \    Post Anesthesia Home Care Instructions  Activity: Get plenty of rest for the remainder of the day. A responsible individual must stay with you for 24 hours following the procedure.  For the next 24 hours, DO NOT: -Drive a car -Advertising copywriter -Drink alcoholic beverages -Take any medication unless instructed by your physician -Make any legal decisions or sign important papers.  Meals: Start with liquid foods such as gelatin or soup. Progress to regular foods as tolerated. Avoid greasy, spicy, heavy foods. If nausea  and/or vomiting occur, drink only clear liquids until the nausea and/or vomiting subsides. Call your physician if vomiting continues.  Special Instructions/Symptoms: Your throat may feel dry or sore from the anesthesia or the breathing tube placed in your throat during surgery. If this causes discomfort, gargle with warm salt water. The discomfort should disappear within 24 hours.  If you had a scopolamine patch placed behind your ear for the management of post- operative nausea and/or vomiting:  1. The medication in the patch is effective for 72 hours, after which it should be removed.  Wrap patch in a tissue and discard in the trash. Wash hands thoroughly with soap and water. 2. You may remove the patch earlier than 72 hours if you experience unpleasant side effects which may include dry mouth, dizziness or visual disturbances. 3. Avoid touching the patch. Wash your hands with soap and water after contact with the patch.      [facility] [phone] Post Ureteroscopy With or Without Stent Instructions  Definitions:  Ureter: The duct that transports urine from the kidney to the bladder. Stent:   A plastic hollow tube that is placed into the ureter, from the kidney to the bladder to prevent the ureter from swelling shut.  GENERAL INSTRUCTIONS:  Despite the fact that no skin incisions were used, the area around the ureter and bladder is raw and irritated. The stent is a foreign body which will further irritate the bladder  wall. This irritation is manifested by increased frequency of urination, both day and night, and by an increase in the urge to urinate. In some, the urge to urinate is present almost always. Sometimes the urge is strong enough that you may not be able to stop yourself from urinating. The only real cure is to remove the stent and then give time for the bladder wall to heal which can't be done until the danger of the ureter swelling shut has passed, which  varies.  You may see some blood in your urine while the stent is in place and a few days afterwards. Do not be alarmed, even if the urine was clear for a while. Get off your feet and drink lots of fluids until clearing occurs. If you start to pass clots or don't improve, call us.  DIET: You may return to your normal diet immediately. Because of the raw surface of your bladder, alcohol, spicy foods, acid type foods and drinks with caffeine may cause irritation or frequency and should be used in moderation. To keep your urine flowing freely and to avoid constipation, drink plenty of fluids during the day ( 8-10 glasses ). Tip: Avoid cranberry juice because it is very acidic.  ACTIVITY: Your physical activity doesn't need to be restricted. However, if you are very active, you may see some blood in your urine. We suggest that you reduce your activity under these circumstances until the bleeding has stopped.  BOWELS: It is important to keep your bowels regular during the postoperative period. Straining with bowel movements can cause bleeding. A bowel movement every other day is reasonable. Use a mild laxative if needed, such as Milk of Magnesia 2-3 tablespoons, or 2 Dulcolax tablets. Call if you continue to have problems. If you have been taking narcotics for pain, before, during or after your surgery, you may be constipated. Take a laxative if necessary.   MEDICATION: You should resume your pre-surgery medications unless told not to. In addition you will often be given an antibiotic to prevent infection. These should be taken as prescribed until the bottles are finished unless you are having an unusual reaction to one of the drugs.  PROBLEMS YOU SHOULD REPORT TO [place]: Fevers over 100.5 Fahrenheit. Heavy bleeding, or clots ( See above notes about blood in urine ). Inability to urinate. Drug reactions ( hives, rash, nausea, vomiting, diarrhea ). Severe burning or pain with urination that is not  improving.  FOLLOW-UP: You will need a follow-up appointment to monitor your progress. Call for this appointment at the number listed above. Usually the first appointment will be about three to fourteen days after your surgery.

## 2021-08-17 NOTE — H&P (Signed)
Kendra Morgan is an 25 y.o. female.    Chief Complaint: Pre-op LEFT UReteroscopy with laser lithotripsy and stent exchange  HPI:   1 - Recurrent Urolithiasis -  07/2018 - s/p bilateral ureteroscopy to stone free  04/2020 - bilateral ureteroscopy to stone free.   2 - Recurrent Urosepsis -  07/2018 - treated in setting of stones  02/2020 -treated in setting of stones. UCX e. coli sens keflex, rocephin, gent, nitro.   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 NO h/o refractory HTN or hypokalemia. Stable on CT 2021.   4 - Medical Stone Disease -  Eval 2019: BMP, PTH, Urate - normal; Composition - 80% CaOx / 20% CaPO4; 24 Hr Urines - pending.   5 - Retained / Encrusted LEFT Ureteral Stent - pt did not pul left stent after ureteroswcopy 04/2020 and now encrusted. She has been variably compliant x years.   Today " Kendra Morgan" is seen to proceed with LEFT ureteroscopy / stent exchange for ecnrusted left ureteral stent. Most recent UC negative.     Past Medical History:  Diagnosis Date   History of kidney stones    Retained ureteral stent    left side   Urgency incontinence    Wears contact lenses     Past Surgical History:  Procedure Laterality Date   CYSTOSCOPY W/ URETERAL STENT PLACEMENT Bilateral 07/14/2018   Procedure: CYSTOSCOPY WITH RETROGRADE PYELOGRAM/URETERAL STENT PLACEMENT;  Surgeon: Sebastian Ache, MD;  Location: [facility];  Service: Urology;  Laterality: Bilateral;   CYSTOSCOPY W/ URETERAL STENT PLACEMENT Right 02/23/2020   Procedure: CYSTOSCOPY WITH RETROGRADE PYELOGRAM AND RIGHT URETERAL STENT PLACEMENT;  Surgeon: Crista Elliot, MD;  Location: [facility];  Service: Urology;  Laterality: Right;   CYSTOSCOPY WITH RETROGRADE PYELOGRAM, URETEROSCOPY AND STENT PLACEMENT Bilateral 08/05/2018   Procedure: CYSTOSCOPY WITH RETROGRADE PYELOGRAM, URETEROSCOPY , BASKET EXTRACTION OF STONES AND STENT EXCHANGE;  Surgeon: Sebastian Ache, MD;  Location[facility];  Service: Urology;  Laterality: Bilateral;   CYSTOSCOPY WITH RETROGRADE PYELOGRAM, URETEROSCOPY AND STENT PLACEMENT Bilateral 05/03/2020   Procedure: CYSTOSCOPY WITH RETROGRADE PYELOGRAM, URETEROSCOPY AND STENT PLACEMENT;  Surgeon: Sebastian Ache, MD;  Location: [facility];  Service: Urology;  Laterality: Bilateral;  75 MINS   HOLMIUM LASER APPLICATION Bilateral 05/03/2020   Procedure: HOLMIUM LASER APPLICATION;  Surgeon: Sebastian Ache, MD;  Location: [facility];  Service: Urology;  Laterality: Bilateral;   WISDOM TOOTH EXTRACTION  2017    Family History  Problem Relation Age of Onset   Urinary tract infection Mother    Social History:  reports that she has never smoked. She has never used smokeless tobacco. She reports that she does not drink alcohol and does not use drugs.  Allergies: No Known Allergies  No medications prior to admission.    No results found for this or any previous visit (from the past 48 hour(s)). No results found.  Review of Systems  Constitutional:  Negative for chills and fever.  Genitourinary:  Positive for urgency.  All other systems reviewed and are negative.  Height 5' (1.524 m), weight 49.9 kg, last menstrual period 07/24/2021. Physical Exam Vitals reviewed.  HENT:     Nose: Nose normal.  Eyes:     Pupils: Pupils are equal, round, and reactive to light.  Cardiovascular:     Rate and Rhythm: Normal rate.  Pulmonary:     Effort: Pulmonary effort is normal.  Abdominal:  General: Abdomen is flat.  Genitourinary:    Comments: No CVAT at present Musculoskeletal:        General: Normal range of motion.     Cervical back: Normal range of motion.  Neurological:     General: No focal deficit present.     Mental Status: She is alert.  Psychiatric:        Mood and Affect: Mood normal.     Assessment/Plan  Proceed as planned with LEFT ureteroscopy / laser lithotripsy / stent exchange for  enrusted stent. Risks,  benefits, alternatives, expected peri-op course discussed. Importance of ongoing outpatinet mangement / metabolic eval discussed as well as dangers of retainted stents which can result in loss of kidney. Also disucssed this may require staged approach with antegrade access if cannot be managed in retrograde fashion.   Sebastian Ache, MD 08/17/2021, 7:31 AM

## 2021-08-17 NOTE — Anesthesia Preprocedure Evaluation (Signed)
Anesthesia Evaluation  Patient identified by MRN, date of birth, ID band Patient awake    Reviewed: Allergy & Precautions, NPO status , Patient's Chart, lab work & pertinent test results  History of Anesthesia Complications (+) history of anesthetic complications (h/o periop uvular injury 02/23/20)  Airway Mallampati: I  TM Distance: >3 FB Neck ROM: Full    Dental no notable dental hx.    Pulmonary neg pulmonary ROS,    Pulmonary exam normal        Cardiovascular Exercise Tolerance: Good negative cardio ROS Normal cardiovascular exam     Neuro/Psych negative neurological ROS  negative psych ROS   GI/Hepatic negative GI ROS, Neg liver ROS,   Endo/Other  negative endocrine ROS  Renal/GU Renal disease (kidney stones and hydronephrosis)  negative genitourinary   Musculoskeletal negative musculoskeletal ROS (+)   Abdominal   Peds  Hematology negative hematology ROS (+)   Anesthesia Other Findings   Reproductive/Obstetrics negative OB ROS                             Anesthesia Physical  Anesthesia Plan  ASA: 2  Anesthesia Plan: General   Post-op Pain Management:    Induction: Intravenous  PONV Risk Score and Plan: 3 and Midazolam, Ondansetron, Dexamethasone, Scopolamine patch - Pre-op and Treatment may vary due to age or medical condition  Airway Management Planned: LMA  Additional Equipment: None  Intra-op Plan:   Post-operative Plan: Extubation in OR  Informed Consent: I have reviewed the patients History and Physical, chart, labs and discussed the procedure including the risks, benefits and alternatives for the proposed anesthesia with the patient or authorized representative who has indicated his/her understanding and acceptance.     Dental advisory given  Plan Discussed with: CRNA, Surgeon and Anesthesiologist  Anesthesia Plan Comments:         Anesthesia Quick  Evaluation

## 2021-08-20 ENCOUNTER — Encounter (HOSPITAL_BASED_OUTPATIENT_CLINIC_OR_DEPARTMENT_OTHER): Payer: Self-pay | Admitting: Urology

## 2021-08-20 ENCOUNTER — Other Ambulatory Visit: Payer: Self-pay | Admitting: Urology

## 2021-08-20 NOTE — Anesthesia Postprocedure Evaluation (Signed)
Anesthesia Post Note  Patient: Kendra Morgan  Procedure(s) Performed: CYSTOSCOPY WITH RETROGRADE PYELOGRAM, URETEROSCOPY STONE EXTRACTION AND EXCHANGE. FIRST STAGE. (Left) HOLMIUM LASER APPLICATION (Left)     Patient location during evaluation: PACU Anesthesia Type: General Level of consciousness: awake and alert Pain management: pain level controlled Vital Signs Assessment: post-procedure vital signs reviewed and stable Respiratory status: spontaneous breathing, nonlabored ventilation and respiratory function stable Cardiovascular status: blood pressure returned to baseline and stable Postop Assessment: no apparent nausea or vomiting Anesthetic complications: no   No notable events documented.  Last Vitals:  Vitals:   08/17/21 1500 08/17/21 1511  BP: 107/61 105/71  Pulse: 89 85  Resp: 10 12  Temp:  36.5 C  SpO2: 100% 100%    Last Pain:  Vitals:   08/17/21 1511  TempSrc:   PainSc: 0-No pain   Pain Goal: Patients Stated Pain Goal: 4 (08/17/21 1211)                 Mellody Dance

## 2021-08-20 NOTE — Op Note (Signed)
NAME: Kendra Morgan, Kendra Morgan MEDICAL RECORD NO: [id number] ACCOUNT NO: [id number] DATE OF BIRTH: 1996/04/12 FACILITY: [facility] LOCATION: [facility] PHYSICIAN: Sebastian Ache, MD  Operative Report   DATE OF PROCEDURE: 08/17/2021  PREOPERATIVE DIAGNOSIS:  Retained encrusted left ureteral stent.  PROCEDURE PERFORMED:  1.  Cystoscopy with left retrograde pyelogram interpretation. 2.  Left ureteroscopy with laser lithotripsy, first stage. 3.  Placement of left ureteral stent, 4.8 x 24.  ESTIMATED BLOOD LOSS:  Nil.  COMPLICATIONS:  None.  SPECIMEN:  Distal one-fourth of retained stent for discard.  FINDINGS:  1.  Significantly encrusted left ureteral stent at its proximal fifth. 2.  Resolution of approximately estimated 60-70% of encrustation on proximal stent. 3.  Successful placement of new parallel left ureteral stent, proximal end in renal pelvis, distal end in urinary bladder. 4.  Moderate hydronephrosis.  INDICATIONS FOR PROCEDURE:  The patient is a pleasant but variably compliant 25 year old young woman with history of recurrent urolithiasis.  She is status post bilateral ureteroscopy in 2021 for recurrent urolithiasis.  She had tethered stents at that  time. She retrieved one of them at home, but not the left.  She did not return for followup as scheduled.  She represented and was found to have retained stent over a year after placement, with the proximal end showing significant signs of encrustation.   It was felt the safest means of retrieval would be operative retrieval with laser lithotripsy of the encrustation to prevent any sort of high-grade ureteral injury.  She wished to proceed.  Informed consent was obtained and placed in the medical record.  PROCEDURE IN DETAIL:  The patient being verified, procedure being left ureteroscopy with laser lithotripsy, stent retrieval and exchange was confirmed.  Procedure timeout was performed.  Intravenous antibiotics administered. General  anesthesia  introduced.    The patient placed into a low lithotomy position.  Sterile field was created, prepped and draped the patient's vagina, introitus and proximal thigh using iodine.  Cystourethroscopy was performed using 21-French rigid cystoscope with offset  lens.  Inspection of the urinary bladder revealed at the very distal most aspect of the retained left ureteral stent at the orifice, there was some mild mucosal edema as expected.  There was minimal encrustation at the distal portion.  This was very  carefully grasped with a rigid graspers and very gentle pressure applied under fluoroscopic vision and the stent would not easily uncoil.  This was felt to likely represent rigid encrustation of the proximal stent.  As such, the distal aspect of the  stent was grasped with a hemostat, externalized to provide gentle antegrade pressure to the stent and semirigid ureteroscopy was performed using a single channel semirigid ureteroscope alongside the stent. At approximately the proximal fifth of the  stent, significant encrustation was encountered.  This was circumferential and very dense.  As such, holmium laser energy was applied to the stone using settings of 0.2 joules and 20 Hz and using dusting technique in a very careful circumferential  fashion, the encrustation was carefully dusted proximally all the way to the area of the UPJ and renal pelvis, such that the proximal most stent was just visualized as well as the proximal coil, which had a very large amount of stone on it. Again using  very careful antegrade pressure on the retained stent, this is brought in apposition to the semirigid scope and very careful holmium energy applied to the stone, this was performed for approximately an hour.  Following an hour of very careful  dusting  technique, lithotripsy, visualization became quite poor and given the very delicate nature of the procedure and strong desire to avoid any permanent damage, decision  was made to cease this today and return for a second stage procedure, at which point it  was felt that we can very likely achieve residual lithotripsy of the proximal coil of the stent, with goals of removal and hopefully rendering stone free.  As such, a sensor wire was left in place parallel to the stent after retrograde pyelogram was  performed.  Final retrograde pyelogram demonstrated moderate hydronephrosis as expected of the kidney.  No evidence of extravasation along the ureter.  Sensor wire was advanced.  The semirigid scope was removed and a new 4.8 Percuflex type stent was carefully placed  using fluoroscopic guidance.  Good proximal and distal plane were noted. The distal approximately 1/4 of the retained stent was then trimmed and allowed to purposely retract into the bladder, such that it would not be externalized and distal portion  discarded.  The bladder was emptied per cystoscope.  Procedure was then terminated.  The patient tolerated the procedure well.  No immediate perioperative complications.  The patient taken to the postanesthesia care in stable condition.  PLAN:  For discharge home and schedule a second stage procedure, likely in around a 3-week timeframe.  We will reiterate to the patient and her family again strongly the need for second stage procedure and goal of stone and stent free to avoid permanent  irreversible damage to renal function.   SHW D: 08/17/2021 2:00:03 pm T: 08/18/2021 12:45:00 am  JOB: [id number]/ [id number]

## 2021-08-30 ENCOUNTER — Encounter (HOSPITAL_BASED_OUTPATIENT_CLINIC_OR_DEPARTMENT_OTHER): Payer: Self-pay | Admitting: Urology

## 2021-08-30 ENCOUNTER — Other Ambulatory Visit: Payer: Self-pay

## 2021-08-30 NOTE — Progress Notes (Signed)
Spoke w/ via phone for pre-op interview---PT  Lab needs dos----URINE POCT, I STAT (GENT)               Lab results------none COVID test -----patient states asymptomatic no test needed Arrive at -------1300 pm 09-05-2021 NPO after MN NO Solid Food.  Clear liquids from MN until---1200 pm Med rec completed Medications to take morning of surgery -----none Diabetic medication -----n/a Patient instructed no nail polish to be worn day of surgery Patient instructed to bring photo id and insurance card day of surgery Patient aware to have Driver (ride ) / caregiver    for 24 hours after surgery grandmother Patient Special Instructions -----none Pre-Op special Istructions -----none Patient verbalized understanding of instructions that were given at this phone interview. Patient denies shortness of breath, chest pain, fever, cough at this phone interview.

## 2021-09-05 ENCOUNTER — Ambulatory Visit (HOSPITAL_COMMUNITY): Payer: Medicaid Other | Admitting: Anesthesiology

## 2021-09-05 ENCOUNTER — Encounter (HOSPITAL_COMMUNITY)
Admission: RE | Disposition: A | Discharge status: Home / Self Care | Payer: Self-pay | Source: Home / Self Care | Attending: Urology

## 2021-09-05 ENCOUNTER — Ambulatory Visit (HOSPITAL_BASED_OUTPATIENT_CLINIC_OR_DEPARTMENT_OTHER)
Admission: RE | Admit: 2021-09-05 | Discharge: 2021-09-05 | Disposition: A | Discharge status: Home / Self Care | Payer: Medicaid Other | Attending: Urology | Admitting: Urology

## 2021-09-05 ENCOUNTER — Other Ambulatory Visit: Payer: Self-pay

## 2021-09-05 ENCOUNTER — Encounter (HOSPITAL_BASED_OUTPATIENT_CLINIC_OR_DEPARTMENT_OTHER): Payer: Self-pay | Admitting: Urology

## 2021-09-05 ENCOUNTER — Ambulatory Visit (HOSPITAL_COMMUNITY): Payer: Medicaid Other

## 2021-09-05 DIAGNOSIS — Z466 Encounter for fitting and adjustment of urinary device: Secondary | ICD-10-CM | POA: Diagnosis present

## 2021-09-05 DIAGNOSIS — N200 Calculus of kidney: Secondary | ICD-10-CM | POA: Diagnosis not present

## 2021-09-05 HISTORY — PX: HOLMIUM LASER APPLICATION: SHX5852

## 2021-09-05 HISTORY — PX: CYSTOSCOPY WITH RETROGRADE PYELOGRAM, URETEROSCOPY AND STENT PLACEMENT: SHX5789

## 2021-09-05 LAB — POCT I-STAT, CHEM 8
BUN: 9 mg/dL (ref 6–20)
Calcium, Ion: 1.26 mmol/L (ref 1.15–1.40)
Chloride: 102 mmol/L (ref 98–111)
Creatinine, Ser: 0.8 mg/dL (ref 0.44–1.00)
Glucose, Bld: 91 mg/dL (ref 70–99)
HCT: 38 % (ref 36.0–46.0)
Hemoglobin: 12.9 g/dL (ref 12.0–15.0)
Potassium: 3.7 mmol/L (ref 3.5–5.1)
Sodium: 141 mmol/L (ref 135–145)
TCO2: 24 mmol/L (ref 22–32)

## 2021-09-05 LAB — POCT PREGNANCY, URINE: Preg Test, Ur: NEGATIVE

## 2021-09-05 SURGERY — CYSTOURETEROSCOPY, WITH RETROGRADE PYELOGRAM AND STENT INSERTION
Anesthesia: General | Laterality: Left

## 2021-09-05 MED ORDER — IOHEXOL 300 MG/ML  SOLN
INTRAMUSCULAR | Status: DC | PRN
  Administered 2021-09-05: 10 mL

## 2021-09-05 MED ORDER — SCOPOLAMINE 1 MG/3DAYS TD PT72
1.0000 | MEDICATED_PATCH | TRANSDERMAL | Status: DC
  Administered 2021-09-05: 1.5 mg via TRANSDERMAL

## 2021-09-05 MED ORDER — ONDANSETRON HCL 4 MG/2ML IJ SOLN
INTRAMUSCULAR | Status: DC | PRN
  Administered 2021-09-05: 4 mg via INTRAVENOUS

## 2021-09-05 MED ORDER — LIDOCAINE 2% (20 MG/ML) 5 ML SYRINGE
INTRAMUSCULAR | Status: DC | PRN
  Administered 2021-09-05: 80 mg via INTRAVENOUS

## 2021-09-05 MED ORDER — MIDAZOLAM HCL 2 MG/2ML IJ SOLN
INTRAMUSCULAR | Status: AC
  Filled 2021-09-05: qty 2

## 2021-09-05 MED ORDER — LIDOCAINE HCL (PF) 2 % IJ SOLN
INTRAMUSCULAR | Status: AC
  Filled 2021-09-05: qty 5

## 2021-09-05 MED ORDER — FENTANYL CITRATE (PF) 100 MCG/2ML IJ SOLN
INTRAMUSCULAR | Status: AC
  Filled 2021-09-05: qty 2

## 2021-09-05 MED ORDER — FENTANYL CITRATE (PF) 100 MCG/2ML IJ SOLN
INTRAMUSCULAR | Status: DC | PRN
  Administered 2021-09-05 (×2): 50 ug via INTRAVENOUS
  Administered 2021-09-05: 100 ug via INTRAVENOUS

## 2021-09-05 MED ORDER — PROPOFOL 10 MG/ML IV BOLUS
INTRAVENOUS | Status: DC | PRN
  Administered 2021-09-05: 120 mg via INTRAVENOUS

## 2021-09-05 MED ORDER — SODIUM CHLORIDE 0.9 % IR SOLN
Status: DC | PRN
  Administered 2021-09-05: 3000 mL

## 2021-09-05 MED ORDER — 0.9 % SODIUM CHLORIDE (POUR BTL) OPTIME
TOPICAL | Status: DC | PRN
Start: 2021-09-05 — End: 2021-09-06
  Administered 2021-09-05: 1000 mL

## 2021-09-05 MED ORDER — LACTATED RINGERS IV SOLN: INTRAVENOUS | Status: DC

## 2021-09-05 MED ORDER — MIDAZOLAM HCL 5 MG/5ML IJ SOLN
INTRAMUSCULAR | Status: DC | PRN
  Administered 2021-09-05: 2 mg via INTRAVENOUS

## 2021-09-05 MED ORDER — CHLORHEXIDINE GLUCONATE 0.12 % MT SOLN
15.0000 mL | OROMUCOSAL | Status: AC
  Administered 2021-09-05: 15 mL via OROMUCOSAL

## 2021-09-05 MED ORDER — GENTAMICIN SULFATE 40 MG/ML IJ SOLN
5.0000 mg/kg | INTRAVENOUS | Status: AC
  Administered 2021-09-05: 240 mg via INTRAVENOUS
  Filled 2021-09-05: qty 6

## 2021-09-05 MED ORDER — OXYCODONE-ACETAMINOPHEN 5-325 MG PO TABS
1.0000 | ORAL_TABLET | Freq: Four times a day (QID) | ORAL | 0 refills | Status: AC | PRN
Start: 2021-09-05 — End: 2022-09-05

## 2021-09-05 MED ORDER — SCOPOLAMINE 1 MG/3DAYS TD PT72
MEDICATED_PATCH | TRANSDERMAL | Status: AC
  Filled 2021-09-05: qty 1

## 2021-09-05 MED ORDER — PHENYLEPHRINE 40 MCG/ML (10ML) SYRINGE FOR IV PUSH (FOR BLOOD PRESSURE SUPPORT)
PREFILLED_SYRINGE | INTRAVENOUS | Status: DC | PRN
  Administered 2021-09-05: 80 ug via INTRAVENOUS

## 2021-09-05 MED ORDER — OXYCODONE HCL 5 MG PO TABS: 5.0000 mg | ORAL_TABLET | Freq: Once | ORAL | Status: DC | PRN

## 2021-09-05 MED ORDER — ACETAMINOPHEN 10 MG/ML IV SOLN: 1000.0000 mg | Freq: Once | INTRAVENOUS | Status: DC | PRN

## 2021-09-05 MED ORDER — DEXAMETHASONE SODIUM PHOSPHATE 10 MG/ML IJ SOLN
INTRAMUSCULAR | Status: AC
  Filled 2021-09-05: qty 1

## 2021-09-05 MED ORDER — FENTANYL CITRATE PF 50 MCG/ML IJ SOSY: 25.0000 ug | PREFILLED_SYRINGE | INTRAMUSCULAR | Status: DC | PRN

## 2021-09-05 MED ORDER — DEXMEDETOMIDINE (PRECEDEX) IN NS 20 MCG/5ML (4 MCG/ML) IV SYRINGE
PREFILLED_SYRINGE | INTRAVENOUS | Status: AC
  Filled 2021-09-05: qty 10

## 2021-09-05 MED ORDER — ONDANSETRON HCL 4 MG/2ML IJ SOLN
INTRAMUSCULAR | Status: AC
  Filled 2021-09-05: qty 2

## 2021-09-05 MED ORDER — OXYCODONE HCL 5 MG/5ML PO SOLN
5.0000 mg | Freq: Once | ORAL | Status: DC | PRN
Start: 2021-09-05 — End: 2021-09-06

## 2021-09-05 MED ORDER — DEXAMETHASONE SODIUM PHOSPHATE 10 MG/ML IJ SOLN
INTRAMUSCULAR | Status: DC | PRN
  Administered 2021-09-05: 8 mg via INTRAVENOUS

## 2021-09-05 MED ORDER — CEPHALEXIN 500 MG PO CAPS: 500.0000 mg | ORAL_CAPSULE | Freq: Two times a day (BID) | ORAL | 0 refills | Status: AC

## 2021-09-05 MED ORDER — PROPOFOL 10 MG/ML IV BOLUS
INTRAVENOUS | Status: AC
  Filled 2021-09-05: qty 20

## 2021-09-05 MED ORDER — KETOROLAC TROMETHAMINE 10 MG PO TABS: 10.0000 mg | ORAL_TABLET | Freq: Three times a day (TID) | ORAL | 0 refills | Status: AC | PRN

## 2021-09-05 MED ORDER — DEXMEDETOMIDINE (PRECEDEX) IN NS 20 MCG/5ML (4 MCG/ML) IV SYRINGE
PREFILLED_SYRINGE | INTRAVENOUS | Status: DC | PRN
  Administered 2021-09-05: 8 ug via INTRAVENOUS

## 2021-09-05 MED ORDER — EPHEDRINE SULFATE-NACL 50-0.9 MG/10ML-% IV SOSY
PREFILLED_SYRINGE | INTRAVENOUS | Status: DC | PRN
  Administered 2021-09-05: 15 mg via INTRAVENOUS

## 2021-09-05 SURGICAL SUPPLY — 23 items
BAG DRAIN URO-CYSTO SKYTR STRL (DRAIN) ×2 IMPLANT
BASKET LASER NITINOL 1.9FR (BASKET) ×2 IMPLANT
CATH INTERMIT  6FR 70CM (CATHETERS) ×2 IMPLANT
CLOTH BEACON ORANGE TIMEOUT ST (SAFETY) ×2 IMPLANT
FIBER LASER FLEXIVA 365 (UROLOGICAL SUPPLIES) IMPLANT
GLOVE SURG ENC MOIS LTX SZ7.5 (GLOVE) ×2 IMPLANT
GOWN STRL REUS W/TWL LRG LVL3 (GOWN DISPOSABLE) ×2 IMPLANT
GUIDEWIRE ANG ZIPWIRE 038X150 (WIRE) ×2 IMPLANT
GUIDEWIRE STR DUAL SENSOR (WIRE) ×2 IMPLANT
IV NS 1000ML (IV SOLUTION) ×2
IV NS 1000ML BAXH (IV SOLUTION) ×1 IMPLANT
IV NS IRRIG 3000ML ARTHROMATIC (IV SOLUTION) ×2 IMPLANT
KIT TURNOVER CYSTO (KITS) ×2 IMPLANT
MANIFOLD NEPTUNE II (INSTRUMENTS) ×2 IMPLANT
NS IRRIG 500ML POUR BTL (IV SOLUTION) ×2 IMPLANT
PACK CYSTO (CUSTOM PROCEDURE TRAY) ×2 IMPLANT
STENT POLARIS 5FRX22 (STENTS) ×2 IMPLANT
SYR 10ML LL (SYRINGE) IMPLANT
TRACTIP FLEXIVA PULS ID 200XHI (Laser) IMPLANT
TRACTIP FLEXIVA PULSE ID 200 (Laser)
TUBE CONNECTING 12X1/4 (SUCTIONS) ×2 IMPLANT
TUBE FEEDING 8FR 16IN STR KANG (MISCELLANEOUS) ×2 IMPLANT
TUBING UROLOGY SET (TUBING) ×2 IMPLANT

## 2021-09-05 NOTE — Transfer of Care (Signed)
Immediate Anesthesia Transfer of Care Note  Patient: Kendra Morgan  Procedure(s) Performed: SECOND STAGE CYSTOSCOPY WITH RETROGRADE PYELOGRAM, URETEROSCOPY AND STENT EXCHANGE (Left) HOLMIUM LASER APPLICATION (Left)  Patient Location: PACU  Anesthesia Type:General  Level of Consciousness: drowsy and patient cooperative  Airway & Oxygen Therapy: Patient Spontanous Breathing and Patient connected to face mask oxygen  Post-op Assessment: Report given to RN and Post -op Vital signs reviewed and stable  Post vital signs: Reviewed and stable  Last Vitals:  Vitals Value Taken Time  BP 105/63 09/05/21 1900  Temp    Pulse 76 09/05/21 1900  Resp 14 09/05/21 1900  SpO2 100 % 09/05/21 1900  Vitals shown include unvalidated device data.  Last Pain:  Vitals:   09/05/21 1343  TempSrc: Oral  PainSc: 0-No pain      Patients Stated Pain Goal: 6 (09/05/21 1343)  Complications: No notable events documented.

## 2021-09-05 NOTE — Discharge Instructions (Addendum)
1 - You may have urinary urgency (bladder spasms) and bloody urine on / off with stent in place. This is normal.  2 - Remove tethered stent on Friday morning at home by pulling on string, then blue-white plastic tubing, and discarding. Office is open Friday if any problems arise. It is crucial that the stent is removed to avoid future stent encrustation events.   3 - Call MD or go to ER for fever >102, severe pain / nausea / vomiting not relieved by medications, or acute change in medical status

## 2021-09-05 NOTE — Anesthesia Preprocedure Evaluation (Signed)
Anesthesia Evaluation  Patient identified by MRN, date of birth, ID band Patient awake    Reviewed: Allergy & Precautions, NPO status , Patient's Chart, lab work & pertinent test results  Airway Mallampati: I  TM Distance: >3 FB Neck ROM: Full    Dental  (+) Teeth Intact   Pulmonary neg pulmonary ROS,    Pulmonary exam normal        Cardiovascular negative cardio ROS   Rhythm:Regular Rate:Normal     Neuro/Psych negative neurological ROS  negative psych ROS   GI/Hepatic negative GI ROS, Neg liver ROS,   Endo/Other  negative endocrine ROS  Renal/GU Renal diseaseRetained ureteral stent  negative genitourinary   Musculoskeletal negative musculoskeletal ROS (+)   Abdominal (+)  Abdomen: soft.    Peds  Hematology negative hematology ROS (+)   Anesthesia Other Findings   Reproductive/Obstetrics                            Anesthesia Physical Anesthesia Plan  ASA: 2  Anesthesia Plan: General   Post-op Pain Management:    Induction: Intravenous  PONV Risk Score and Plan: 3 and Ondansetron, Dexamethasone, Midazolam and Treatment may vary due to age or medical condition  Airway Management Planned: Mask and LMA  Additional Equipment: None  Intra-op Plan:   Post-operative Plan: Extubation in OR  Informed Consent: I have reviewed the patients History and Physical, chart, labs and discussed the procedure including the risks, benefits and alternatives for the proposed anesthesia with the patient or authorized representative who has indicated his/her understanding and acceptance.     Dental advisory given  Plan Discussed with: CRNA  Anesthesia Plan Comments: (Lab Results      Component                Value               Date                      WBC                      13.0 (H)            07/18/2021                HGB                      12.9                09/05/2021                 HCT                      38.0                09/05/2021                MCV                      85.9                07/18/2021                PLT                      345  07/18/2021           Lab Results      Component                Value               Date                      NA                       141                 09/05/2021                K                        3.7                 09/05/2021                CO2                      26                  07/18/2021                GLUCOSE                  91                  09/05/2021                BUN                      9                   09/05/2021                CREATININE               0.80                09/05/2021                CALCIUM                  9.7                 07/18/2021                GFRNONAA                 >60                 07/18/2021           Lab Results      Component                Value               Date                      PREGTESTUR               NEGATIVE            09/05/2021                HCG                      <  5.0                07/18/2021          )        Anesthesia Quick Evaluation

## 2021-09-05 NOTE — Brief Op Note (Signed)
09/05/2021  6:49 PM  PATIENT:  Kendra Morgan  25 y.o. female  PRE-OPERATIVE DIAGNOSIS:  RETAINED ENCRUSTED LEFT URETERAL STENT  POST-OPERATIVE DIAGNOSIS:  RETAINED ENCRUSTED LEFT URETERAL STENT  PROCEDURE:  Procedure(s) with comments: SECOND STAGE CYSTOSCOPY WITH RETROGRADE PYELOGRAM, URETEROSCOPY AND STENT EXCHANGE (Left) - 90 MINS HOLMIUM LASER APPLICATION (Left)  SURGEON:  Surgeon(s) and Role:    * Sebastian Ache, MD - Primary  PHYSICIAN ASSISTANT:   ASSISTANTS: none   ANESTHESIA:   general  EBL:  minimal   BLOOD ADMINISTERED:none  DRAINS: none   LOCAL MEDICATIONS USED:  NONE  SPECIMEN:  Source of Specimen:  left renal   DISPOSITION OF SPECIMEN:   given to pt  COUNTS:  YES  TOURNIQUET:  * No tourniquets in log *  DICTATION: .Other Dictation: Dictation Number [id number]  PLAN OF CARE: Discharge to home after PACU  PATIENT DISPOSITION:  PACU - hemodynamically stable.   Delay start of Pharmacological VTE agent (>24hrs) due to surgical blood loss or risk of bleeding: yes

## 2021-09-05 NOTE — Anesthesia Procedure Notes (Signed)
Procedure Name: LMA Insertion Date/Time: 09/05/2021 5:43 PM Performed by: Kizzie Fantasia, CRNA Pre-anesthesia Checklist: Emergency Drugs available, Patient identified, Suction available, Patient being monitored and Timeout performed Patient Re-evaluated:Patient Re-evaluated prior to induction Oxygen Delivery Method: Circle system utilized Preoxygenation: Pre-oxygenation with 100% oxygen Induction Type: IV induction Ventilation: Mask ventilation without difficulty LMA: LMA inserted LMA Size: 3.0 Number of attempts: 1 Placement Confirmation: positive ETCO2 and breath sounds checked- equal and bilateral Tube secured with: Tape Dental Injury: Teeth and Oropharynx as per pre-operative assessment

## 2021-09-05 NOTE — H&P (Signed)
Kendra Morgan is an 25 y.o. female.    Chief Complaint: Pre-OP LEFT 2nd Stage Ureteroscopy / Laser / Foreign Body Removal  HPI:   1 - Recurrent Urolithiasis -  07/2018 - s/p bilateral ureteroscopy to stone free  04/2020 - bilateral ureteroscopy to stone free.   2 - Recurrent Urosepsis -  07/2018 - treated in setting of stones  02/2020 -treated in setting of stones. UCX e. coli sens keflex, rocephin, gent, nitro.   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 NO h/o refractory HTN or hypokalemia. Stable on CT 2021.   4 - Medical Stone Disease -  Eval 2019: BMP, PTH, Urate - normal; Composition - 80% CaOx / 20% CaPO4; 24 Hr Urines - pending.   5 - Retained / Encrusted LEFT Ureteral Stent - pt did not pul left stent after ureteroswcopy 04/2020 and now encrusted. She has been variably compliant x years. Underwent 1st stage ureteroscopic removal 08/17/21 at which point distal entrustation and part of proximal encrustation resolved.   Today " Kendra Morgan" is seen to proceed with LEFT 2nd stage ureteroscopy / stent exchange for ecnrusted left ureteral stent. Most recent UC negative.    Past Medical History:  Diagnosis Date   History of kidney stones    Retained ureteral stent    left side   Urgency incontinence    Wears contact lenses    OR GLASSES    Past Surgical History:  Procedure Laterality Date   CYSTOSCOPY W/ URETERAL STENT PLACEMENT Bilateral 07/14/2018   Procedure: CYSTOSCOPY WITH RETROGRADE PYELOGRAM/URETERAL STENT PLACEMENT;  Surgeon: Sebastian Ache, MD;  Location: [facility];  Service: Urology;  Laterality: Bilateral;   CYSTOSCOPY W/ URETERAL STENT PLACEMENT Right 02/23/2020   Procedure: CYSTOSCOPY WITH RETROGRADE PYELOGRAM AND RIGHT URETERAL STENT PLACEMENT;  Surgeon: Crista Elliot, MD;  Location: [facility];  Service: Urology;  Laterality: Right;   CYSTOSCOPY WITH RETROGRADE PYELOGRAM, URETEROSCOPY AND STENT PLACEMENT Bilateral 08/05/2018   Procedure:  CYSTOSCOPY WITH RETROGRADE PYELOGRAM, URETEROSCOPY , BASKET EXTRACTION OF STONES AND STENT EXCHANGE;  Surgeon: Sebastian Ache, MD;  Location: [facility];  Service: Urology;  Laterality: Bilateral;   CYSTOSCOPY WITH RETROGRADE PYELOGRAM, URETEROSCOPY AND STENT PLACEMENT Bilateral 05/03/2020   Procedure: CYSTOSCOPY WITH RETROGRADE PYELOGRAM, URETEROSCOPY AND STENT PLACEMENT;  Surgeon: Sebastian Ache, MD;  Location: [facility];  Service: Urology;  Laterality: Bilateral;  75 MINS   CYSTOSCOPY WITH RETROGRADE PYELOGRAM, URETEROSCOPY AND STENT PLACEMENT Left 08/17/2021   Procedure: CYSTOSCOPY WITH RETROGRADE PYELOGRAM, URETEROSCOPY STONE EXTRACTION AND EXCHANGE. FIRST STAGE.;  Surgeon: Sebastian Ache, MD;  Location: [facility];  Service: Urology;  Laterality: Left;  75 MINS   HOLMIUM LASER APPLICATION Bilateral 05/03/2020   Procedure: HOLMIUM LASER APPLICATION;  Surgeon: Sebastian Ache, MD;  Location: [facility];  Service: Urology;  Laterality: Bilateral;   HOLMIUM LASER APPLICATION Left 08/17/2021   Procedure: HOLMIUM LASER APPLICATION;  Surgeon: Sebastian Ache, MD;  Location: [facility];  Service: Urology;  Laterality: Left;   WISDOM TOOTH EXTRACTION  2017    Family History  Problem Relation Age of Onset   Urinary tract infection Mother    Social History:  reports that she has never smoked. She has never used smokeless tobacco. She reports current alcohol use of about 1.0 standard drink per week. She reports that she does not use drugs.  Allergies: No Known Allergies  No medications prior to admission.  No results found for this or any previous visit (from the past 48 hour(s)). No results found.  Review of Systems  Constitutional:  Negative for fever.  Genitourinary:  Positive for hematuria and urgency.  All other systems reviewed and are negative.  Last menstrual period 08/26/2021. Physical Exam    Assessment/Plan  Proceed as planned with LEFT 2nd stage ureteroscopic stone manipulation / removal of foreign body (encrusted stent). Risks, benefits, alternatives, expected peri-op course discussed previously and reiterated today including need for possible additional staged approach.   Sebastian Ache, MD 09/05/2021, 6:10 AM

## 2021-09-06 ENCOUNTER — Encounter (HOSPITAL_COMMUNITY): Payer: Self-pay | Admitting: Urology

## 2021-09-06 NOTE — Anesthesia Postprocedure Evaluation (Signed)
Anesthesia Post Note  Patient: Kendra Morgan  Procedure(s) Performed: SECOND STAGE CYSTOSCOPY WITH RETROGRADE PYELOGRAM, URETEROSCOPY AND STENT EXCHANGE (Left) HOLMIUM LASER APPLICATION (Left)     Patient location during evaluation: PACU Anesthesia Type: General Level of consciousness: awake and alert Pain management: pain level controlled Vital Signs Assessment: post-procedure vital signs reviewed and stable Respiratory status: spontaneous breathing, nonlabored ventilation, respiratory function stable and patient connected to nasal cannula oxygen Cardiovascular status: blood pressure returned to baseline and stable Postop Assessment: no apparent nausea or vomiting Anesthetic complications: no   No notable events documented.  Last Vitals:  Vitals:   09/05/21 1945 09/05/21 2007  BP: 111/68 114/77  Pulse: (!) 102 97  Resp: 20 18  Temp: 36.7 C 36.7 C  SpO2: 100% 100%    Last Pain:  Vitals:   09/05/21 2007  TempSrc:   PainSc: 0-No pain                 Trevor Iha

## 2021-09-06 NOTE — Op Note (Signed)
NAME: Kendra Morgan, Kendra Morgan MEDICAL RECORD NO: [id number] ACCOUNT NO: [id number] DATE OF BIRTH: 1996-09-15 FACILITY: [facility] LOCATION: [facility] PHYSICIAN: Sebastian Ache, MD  Operative Report   DATE OF PROCEDURE: 09/05/2021   PREOPERATIVE DIAGNOSIS:  History of retained encrusted left ureteral stent.  PROCEDURE PERFORMED:   1.  Cystoscopy, left retrograde pyelogram interpretation. 2.  Left second stage ureteroscopy with laser lithotripsy. 3.  Exchange of left ureteral stent 5 x 22 Polaris with tether.  ESTIMATED BLOOD LOSS:  Nil.  COMPLICATIONS:  None.  SPECIMEN:   1.  Left renal stone/encrusted stone fragments given to the patient. 2.  Left ureteral stent for discard.  FINDINGS:   1.  Complete resolution of all accessible stone fragments and encrustation area in the left kidney and ureter. 2.  Removal of residual left retained ureteral stent.   3.  Successful placement of new left ureteral stent proximally end in renal pelvis, distal end in urinary bladder.  Tether in place.  INDICATIONS:  The patient is a pleasant, but fairly compliant 25 year old young woman with a history of high-risk urolithiasis.  She is status post numerous endoscopic procedure as well as medical expulsive events.  She underwent ureteroscopy last year,  it was stone free, but unfortunately had a retained left ureteral stent in place and did not return for followup.  She subsequently developed severe encrustation of this with some obstruction and large volume stone, especially in the proximal curl.   Options were discussed including recommended path of staged ureteroscopy with removal of her encrusted stent.  She underwent first stage procedure several weeks ago, at which point is was estimated that 50-60% of total stone volume was removed and the  distal portion of the stent was removed purposely.  She presents for second stage procedure today.  Informed consent was obtained and placed in the medical  record.  PROCEDURE: The patient was being verified, procedure being left second stage ureteroscopic stone manipulation with foreign body removal and stent exchange was confirmed.  Procedure timeout was performed.  Intravenous antibiotics administered.  General  LMA anesthesia induced.  The patient was placed in the low lithotomy position.  Sterile field was created.  Prepped and draped the patient's vagina, introitus and proximal thighs using iodine.  Cystourethroscopy was performed using a 21-French rigid  cystoscope with offset lens.  Inspection of the urinary bladder revealed distal end of a small stent case.  It was removed and set aside, inspected and intact, left ureteral orifice was cannulated with a 6-French renal catheter, left retrograde  pyelogram was obtained.  Left retrograde pyelogram demonstrated single left ureter, single system left kidney.  The proximal 3/4th of the retained stent as anticipated.  There were calcifications on the upper curl, it was dissipated.  Using an escape basket and a semi-rigid  ureteroscopy, the distal of the stent was grasped and brought to the level of the urethral meatus and it was clamped with a hemostat and placed on very gentle downward traction.  The ZIPwire was advanced to the level of upper pole site, set aside as a  safety wire.  The 8-French feeding tube placed in the urinary bladder for pressure release.  A semirigid ureteroscopy was performed of the entire length left ureter alongside a separate sensor working wire.  No mucosal abnormalities were found and no  encrustation was noted along the entire visualized portion of the stent. Given that the stent did still have some tethering, so there was likely a stone proximal to the UPJ  area.  The semirigid scope was exchanged over a Sensor working wire for a  flexible digital single channel ureteroscope and inspection of the left kidney was performed.  There was as expected additional significant  encrustation of the proximal end of the stent, proximal curl, which was actually up in an extreme upper pole calyx  with a stone obstructing the infundibulum.  Holmium laser energy was then applied on the stones on a setting of 0.2 joules and 20 Hz.  Using dusting technique, the majority of the remaining encrustation of the proximal stent were released. During the  release of this, the stent gave way, it was freed from the proximal encrustation and was very carefully removed, inspected and residual portion intact and set aside for discard.  Having removed the retained stent, an 11/13 short length access sheath was  placed at the level of the proximal ureter.  Using continuous fluoroscopic guidance over a Sensor working wire and a basket technique was used to remove all encrustations throughout the kidney.  There were numerous, total volume approximately 1.2 cm2.   Following this, complete resolution of all accessible stone fragments were larger than one third mm.  There was no evidence of any retained foreign body.  We achieved the goals of surgery today. Access sheath removed under continuous vision.  No  significant mucosal abnormalities were found.  Given the dusting technique, it was felt that brief interval stenting with a tether should be most prudent.  As such, a new 5 x 22 Polaris-type stent was placed over the safety wire using fluoroscopic  guidance and good proximal and distal plane were noted.  Tether was left in place and tucked per vagina and the procedure was terminated.  The patient tolerated the procedure well.  No immediate perioperative complications.  The patient taken to  postanesthesia care in stable condition.  Plan for discharge home.  She is to remove her stent on Friday morning at home.  This was discussed with the patient and her grandmother.  The importance of removing this additional foreign body and they had a  very good understanding of the importance of this to avoid future  encrustation events.     SUJ D: 09/05/2021 6:57:04 pm T: 09/06/2021 9:08:00 am  JOB: [id number]/ [id number]

## 2021-09-07 NOTE — Addendum Note (Signed)
Addendum  created 09/07/21 0935 by Trevor Iha, MD   Attestation recorded in Intraprocedure, Intraprocedure Attestations filed

## 2024-09-04 ENCOUNTER — Emergency Department (HOSPITAL_COMMUNITY)
Admission: EM | Admit: 2024-09-04 | Discharge: 2024-09-04 | Disposition: A | Discharge status: Home / Self Care | Attending: Emergency Medicine | Admitting: Emergency Medicine

## 2024-09-04 ENCOUNTER — Emergency Department (HOSPITAL_COMMUNITY)

## 2024-09-04 DIAGNOSIS — R10A2 Flank pain, left side: Secondary | ICD-10-CM | POA: Diagnosis present

## 2024-09-04 DIAGNOSIS — R509 Fever, unspecified: Secondary | ICD-10-CM | POA: Diagnosis not present

## 2024-09-04 DIAGNOSIS — R300 Dysuria: Secondary | ICD-10-CM | POA: Diagnosis not present

## 2024-09-04 DIAGNOSIS — E279 Disorder of adrenal gland, unspecified: Secondary | ICD-10-CM | POA: Insufficient documentation

## 2024-09-04 LAB — CBC
HCT: 44 % (ref 36.0–46.0)
Hemoglobin: 14.3 g/dL (ref 12.0–15.0)
MCH: 29.2 pg (ref 26.0–34.0)
MCHC: 32.5 g/dL (ref 30.0–36.0)
MCV: 90 fL (ref 80.0–100.0)
Platelets: 300 K/uL (ref 150–400)
RBC: 4.89 MIL/uL (ref 3.87–5.11)
RDW: 12.7 % (ref 11.5–15.5)
WBC: 10 K/uL (ref 4.0–10.5)
nRBC: 0 % (ref 0.0–0.2)

## 2024-09-04 LAB — RESP PANEL BY RT-PCR (RSV, FLU A&B, COVID)  RVPGX2
Influenza A by PCR: NEGATIVE
Influenza B by PCR: NEGATIVE
Resp Syncytial Virus by PCR: NEGATIVE
SARS Coronavirus 2 by RT PCR: NEGATIVE

## 2024-09-04 LAB — BASIC METABOLIC PANEL WITH GFR
Anion gap: 14 (ref 5–15)
BUN: 12 mg/dL (ref 6–20)
CO2: 24 mmol/L (ref 22–32)
Calcium: 9.7 mg/dL (ref 8.9–10.3)
Chloride: 100 mmol/L (ref 98–111)
Creatinine, Ser: 0.98 mg/dL (ref 0.44–1.00)
GFR, Estimated: >60 mL/min (ref >60)
Glucose, Bld: 85 mg/dL (ref 70–99)
Potassium: 4.1 mmol/L (ref 3.5–5.1)
Sodium: 138 mmol/L (ref 135–145)

## 2024-09-04 LAB — URINALYSIS, ROUTINE W REFLEX MICROSCOPIC
Bilirubin Urine: NEGATIVE
Glucose, UA: NEGATIVE mg/dL
Hgb urine dipstick: NEGATIVE
Ketones, ur: NEGATIVE mg/dL
Leukocytes,Ua: NEGATIVE
Nitrite: NEGATIVE
Protein, ur: NEGATIVE mg/dL
Specific Gravity, Urine: 1.03 (ref 1.005–1.030)
pH: 5 (ref 5.0–8.0)

## 2024-09-04 LAB — WET PREP, GENITAL
Clue Cells Wet Prep HPF POC: NONE SEEN
Sperm: NONE SEEN
Trich, Wet Prep: NONE SEEN
WBC, Wet Prep HPF POC: <10 (ref <10)
Yeast Wet Prep HPF POC: NONE SEEN

## 2024-09-04 LAB — HCG, SERUM, QUALITATIVE: Preg, Serum: NEGATIVE

## 2024-09-04 LAB — GROUP A STREP BY PCR: Group A Strep by PCR: NOT DETECTED

## 2024-09-04 MED ORDER — ONDANSETRON HCL 4 MG/2ML IJ SOLN
4.0000 mg | Freq: Once | INTRAMUSCULAR | Status: AC
  Administered 2024-09-04: 4 mg via INTRAVENOUS
  Filled 2024-09-04: qty 2

## 2024-09-04 MED ORDER — IOHEXOL 300 MG/ML  SOLN
80.0000 mL | Freq: Once | INTRAMUSCULAR | Status: AC | PRN
Start: 2024-09-04 — End: 2024-09-04
  Administered 2024-09-04: 80 mL via INTRAVENOUS

## 2024-09-04 MED ORDER — PHENAZOPYRIDINE HCL 200 MG PO TABS: 200.0000 mg | ORAL_TABLET | Freq: Three times a day (TID) | ORAL | 0 refills | Status: AC

## 2024-09-04 NOTE — ED Triage Notes (Signed)
 Last night had fever 102F. Left flank pain radiating to front for 4 days. Some pain with urination.

## 2024-09-04 NOTE — ED Provider Notes (Signed)
 [place] [facility]MENT AT [facility] Provider Note   CSN: [id number] Arrival date & time: 09/04/24  1550     Patient presents with: No chief complaint on file.   Kendra Morgan is a 28 y.o. female.  HPI Patient is a 28 year old female presenting ED today for concerns for left-sided flank pain radiating to left lower quadrant of abdomen and fever that started acutely yesterday.  Note that she has had a 2-day history of watery diarrhea, dysuria.  Notes that her fever broke today, no longer experiencing any body aches or chills that she was experiencing previously.  medical history of urosepsis, nephrolithiasis LMP 10/15.  Endorses nausea  Denies headache, vision changes, chest pain, shortness of breath, vomiting, hematuria, melena, hematochezia, vaginal discharge, vaginal bleeding, lower leg edema, rashes.    Prior to Admission medications   Medication Sig Start Date End Date Taking? Authorizing Provider  phenazopyridine (PYRIDIUM) 200 MG tablet Take 1 tablet (200 mg total) by mouth 3 (three) times daily. 09/04/24  Yes Chontel Warning S, PA-C  BIOTIN PO Take by mouth daily.    [provider]  EPINEPHrine  (EPIPEN  2-PAK) 0.3 mg/0.3 mL IJ SOAJ injection Inject 0.3 mLs (0.3 mg total) into the muscle once for 1 dose. 07/16/18 08/14/21  Rizwan, Saima, MD  ketorolac  (TORADOL ) 10 MG tablet Take 1 tablet (10 mg total) by mouth every 8 (eight) hours as needed for moderate pain (post-operatively). 09/05/21   Alvaro Ricardo KATHEE Mickey., MD  senna-docusate (SENOKOT-S) 8.6-50 MG tablet Take 1 tablet by mouth 2 (two) times daily. While taking strongest pain meds to prevent constipation 08/17/21   Manny, Ricardo KATHEE Mickey., MD    Allergies: Patient has no known allergies.    Review of Systems  Gastrointestinal:  Positive for abdominal pain.  Genitourinary:  Positive for dysuria and flank pain.  All other systems reviewed and are negative.   Updated Vital Signs BP 102/71 (BP  Location: Right Arm)   Pulse 91   Temp 98.9 F (37.2 C) (Oral)   Resp 16   SpO2 100%   Physical Exam Vitals and nursing note reviewed.  Constitutional:      General: She is not in acute distress.    Appearance: Normal appearance. She is not ill-appearing or diaphoretic.  HENT:     Head: Normocephalic and atraumatic.  Eyes:     General: No scleral icterus.       Right eye: No discharge.        Left eye: No discharge.     Extraocular Movements: Extraocular movements intact.     Conjunctiva/sclera: Conjunctivae normal.  Cardiovascular:     Rate and Rhythm: Normal rate and regular rhythm.     Pulses: Normal pulses.     Heart sounds: Normal heart sounds. No murmur heard.    No friction rub. No gallop.  Pulmonary:     Effort: Pulmonary effort is normal. No respiratory distress.     Breath sounds: No stridor. No wheezing, rhonchi or rales.  Chest:     Chest wall: No tenderness.  Abdominal:     General: Abdomen is flat. There is no distension.     Palpations: Abdomen is soft.     Tenderness: There is no abdominal tenderness. There is left CVA tenderness. There is no right CVA tenderness, guarding or rebound.  Musculoskeletal:        General: No swelling, deformity or signs of injury.     Cervical back: Normal range of motion.  No rigidity.     Right lower leg: No edema.     Left lower leg: No edema.  Skin:    General: Skin is warm and dry.     Findings: No bruising, erythema or lesion.  Neurological:     General: No focal deficit present.     Mental Status: She is alert and oriented to person, place, and time. Mental status is at baseline.     Sensory: No sensory deficit.     Motor: No weakness.  Psychiatric:        Mood and Affect: Mood normal.     (all labs ordered are listed, but only abnormal results are displayed) Labs Reviewed  URINALYSIS, ROUTINE W REFLEX MICROSCOPIC - Abnormal; Notable for the following components:      Result Value   APPearance HAZY (*)    All  other components within normal limits  GROUP A STREP BY PCR  RESP PANEL BY RT-PCR (RSV, FLU A&B, COVID)  RVPGX2  WET PREP, GENITAL  URINE CULTURE  HCG, SERUM, QUALITATIVE  BASIC METABOLIC PANEL WITH GFR  CBC  GC/CHLAMYDIA PROBE AMP ([place]) NOT AT [facility]    EKG: None  Radiology: CT ABDOMEN PELVIS W CONTRAST Result Date: 09/04/2024 CLINICAL DATA:  Left lower quadrant abdominal pain with fever. Radiating left flank pain for 4 days. EXAM: CT ABDOMEN AND PELVIS WITH CONTRAST TECHNIQUE: Multidetector CT imaging of the abdomen and pelvis was performed using the standard protocol following bolus administration of intravenous contrast. RADIATION DOSE REDUCTION: This exam was performed according to the departmental dose-optimization program which includes automated exposure control, adjustment of the mA and/or kV according to patient size and/or use of iterative reconstruction technique. CONTRAST:  80mL OMNIPAQUE  IOHEXOL  300 MG/ML  SOLN COMPARISON:  02/22/2020, 07/14/2018. FINDINGS: Lower chest: No acute abnormality. Hepatobiliary: A stable hypervascular lesion is present in the posterior right lobe of the liver measuring 1.3 cm, unchanged from 2019 and likely benign. Fatty infiltration of the liver is noted. No gallstones, gallbladder wall thickening, or biliary dilatation. Pancreas: Unremarkable. No pancreatic ductal dilatation or surrounding inflammatory changes. Spleen: Normal in size without focal abnormality. Adrenals/Urinary Tract: The right adrenal gland is within normal limits. There is a complex nodule in the left adrenal gland measuring 3.7 x 2.7 cm, increased in size from 2019. The kidneys enhance symmetrically. Renal cortical scarring and thinning is noted on the left. No renal calculus or hydronephrosis bilaterally. Air is seen in the non dependent portion of the urinary bladder with mild bladder wall thickening anteriorly. Stomach/Bowel: The stomach is within normal limits. No bowel  obstruction, free air, or pneumatosis is seen. The visualized appendix is normal in caliber. Vascular/Lymphatic: No significant vascular findings are present. No enlarged abdominal or pelvic lymph nodes. Reproductive: Uterus and bilateral adnexa are unremarkable. Other: No abdominopelvic ascites. A small fat containing umbilical hernia is present. Musculoskeletal: No acute osseous abnormality. IMPRESSION: 1. No acute intra-abdominal process. 2. Mild renal cortical scarring on the left. No renal calculus or hydronephrosis bilaterally. 3. Mild bladder wall thickening anteriorly with focus of air in the anterior aspect of the urinary bladder, possible infectious or inflammatory cystitis. 4. Hepatic steatosis. 5. Complex left adrenal nodule measuring 3.7 cm, increased in size from 2019. Nonemergent MRI is recommended for further characterization. Electronically Signed   By: Leita Birmingham M.D.   On: 09/04/2024 17:57    Procedures   Medications Ordered in the ED  ondansetron  (ZOFRAN ) injection 4 mg (4 mg Intravenous Given 09/04/24  1657)  iohexol  (OMNIPAQUE ) 300 MG/ML solution 80 mL (80 mLs Intravenous Contrast Given 09/04/24 1726)                                  Medical Decision Making Amount and/or Complexity of Data Reviewed Labs: ordered. Radiology: ordered.  Risk Prescription drug management.  This patient is a 28 year old female who presents to the ED for concern of acute onset left flank pain noting fever last night also having diarrhea and some left lower quadrant abdominal pain.  History of urosepsis.  On physical exam, patient is in no acute distress, afebrile, alert and orient x 4, speaking in full sentences, nontachypneic, nontachycardic.  LCTAB, RRR, no murmur, does have left-sided CVA tenderness, mild as well as mild left lower abdominal tenderness to palpation.  Unremarkable exam otherwise.  With patient's current mentation, provided lab work and imaging, no vaginal complaints at  this time however with dysuria, swabs were done, GC pending with wet prep unremarkable.  UA unremarkable.  CT scan does show increasing size of adrenal gland nodule on left side and some inflammation around bladder, with no other acute findings otherwise.  Urine culture sent.  Will treat if positive as well as GC pending.  Will send home with Pyridium.  Will also have her continue to follow-up with PCP and establish care as well as with urology for further imaging and evaluation for adrenal nodule, with information relayed to patient.  Low suspicion for any emergent processes at this time.  Patient vital signs have remained stable throughout the course of patient's time in the ED. Low suspicion for any other emergent pathology at this time. I believe this patient is safe to be discharged. Provided strict return to ER precautions. Patient expressed agreement and understanding of plan. All questions were answered.  Differential diagnoses prior to evaluation: The emergent differential diagnosis includes, but is not limited to,  AAA, renal vascular thrombosis, mesenteric ischemia, pyelonephritis, nephrolithiasis, cystitis, biliary colic, pancreatitis, PUD, appendicitis, diverticulitis, bowel obstruction, Ectopic Pregnancy, PID/TOA, Ovarian cyst, Ovarian torsion. This is not an exhaustive differential.   Past Medical History / Co-morbidities / Social History: Pyelonephritis, nephrolithiasis, severe sepsis secondary to UTI  Additional history: Chart reviewed. Pertinent results include:   Last seen by pain medicine on 06/22/2024  Lab Tests/Imaging studies: I personally interpreted labs/imaging and the pertinent results include:    CBC unremarkable CMP unremarkable UA unremarkable Strep test negative Resp panel negative Wet prep unremarkable GC pending  Hcg qual negative  CT abdomen shows no acute process.  Shows mild renal cortical scarring on left side as well as mild bladder wall  thickening, noting that she does have a complex left adrenal nodule measuring 3.7 cm increasing from 2019.   I agree with the radiologist interpretation.    Medications: I ordered medication including Pyridium.  I have reviewed the patients home medicines and have made adjustments as needed.  Critical Interventions: None  Social Determinants of Health: Does not have PCP at this time  Disposition: After consideration of the diagnostic results and the patients response to treatment, I feel that the patient would benefit from discharge as above.   emergency department workup does not suggest an emergent condition requiring admission or immediate intervention beyond what has been performed at this time. The plan is: Establish care with PCP, follow-up with urology, follow-up for imaging for CT findings, return to the ER for any new or  worsening symptoms, symptomatic management at home. The patient is safe for discharge and has been instructed to return immediately for worsening symptoms, change in symptoms or any other concerns.   Final diagnoses:  Dysuria  Left flank pain  Adrenal nodule    ED Discharge Orders          Ordered    phenazopyridine (PYRIDIUM) 200 MG tablet  3 times daily        09/04/24 1848               Beola Terrall RAMAN, [place] 09/04/24 1852    Mannie Pac T, DO 09/05/24 1628

## 2024-09-04 NOTE — Discharge Instructions (Addendum)
 You are seen today for painful urination, left flank pain.  Your lab work and imaging did not show any sign of infection at this time.  However your imaging did note a increasing nodule in your left adrenal gland, we will recommend you continue to follow-up with urology and PCP for further imaging and evaluation.  I have a urine culture pending at this time, they will reach out to you if positive for UTI with antibiotics.  Additionally of sending in some medication to use for the painful urination.  Will recommend you continue to schedule a PCP follow-up within the next week or 2.  I have attached 1 here as well as urology which she will to call both and schedule an appointment.  Return to the ER though if you intervene new or worsening symptoms.

## 2024-09-06 LAB — GC/CHLAMYDIA PROBE AMP (~~LOC~~) NOT AT ARMC
Chlamydia: POSITIVE — AB
Comment: NEGATIVE
Comment: NORMAL
Neisseria Gonorrhea: NEGATIVE

## 2024-09-06 LAB — URINE CULTURE: Culture: 10000 — AB

## 2024-09-07 ENCOUNTER — Telehealth (HOSPITAL_BASED_OUTPATIENT_CLINIC_OR_DEPARTMENT_OTHER): Payer: Self-pay | Admitting: *Deleted

## 2024-09-07 ENCOUNTER — Ambulatory Visit (HOSPITAL_COMMUNITY): Payer: Self-pay

## 2024-09-07 NOTE — Progress Notes (Addendum)
 ED Antimicrobial Stewardship Positive Culture Follow Up   Kendra Morgan is an 28 y.o. female who presented to [facility] on 09/04/2024 with No chief complaint on file.   Recent Results (from the past 720 hours)  Urine Culture     Status: Abnormal   Collection Time: 09/04/24  4:11 PM   Specimen: Urine, Clean Catch  Result Value Ref Range Status   Specimen Description   Final    URINE, CLEAN CATCH Performed at [facility], [address] [place], [place] [address]    Special Requests   Final    NONE Performed at [facility], [address] [place], [place] [address]    Culture (A)  Final    10,000 COLONIES/mL GROUP B STREP(S.AGALACTIAE)ISOLATED TESTING AGAINST S. AGALACTIAE NOT ROUTINELY PERFORMED DUE TO PREDICTABILITY OF AMP/PEN/VAN SUSCEPTIBILITY. Performed at [facility], [address], [place], [place] [address]    Report Status 09/06/2024 FINAL  Final  Group A Strep by PCR     Status: None   Collection Time: 09/04/24  5:04 PM   Specimen: Anterior Nasal Swab; Sterile Swab  Result Value Ref Range Status   Group A Strep by PCR NOT DETECTED NOT DETECTED Final    Comment: Performed at [facility], [address], [place], [place] [address]  Resp panel by RT-PCR (RSV, Flu A&B, Covid) Anterior Nasal Swab     Status: None   Collection Time: 09/04/24  5:04 PM   Specimen: Anterior Nasal Swab  Result Value Ref Range Status   SARS Coronavirus 2 by RT PCR NEGATIVE NEGATIVE Final    Comment: (NOTE) SARS-CoV-2 target nucleic acids are NOT DETECTED.  The SARS-CoV-2 RNA is generally detectable in upper respiratory specimens during the acute phase of infection. The lowest concentration of SARS-CoV-2 viral copies this assay can detect is 138 copies/mL. A negative result does not preclude SARS-Cov-2 infection and should not be used as the sole basis for treatment or other patient management decisions. A negative result  may occur with  improper specimen collection/handling, submission of specimen other than nasopharyngeal swab, presence of viral mutation(s) within the areas targeted by this assay, and inadequate number of viral copies(<138 copies/mL). A negative result must be combined with clinical observations, patient history, and epidemiological information. The expected result is Negative.  Fact Sheet for Patients:  bloggercourse.com  Fact Sheet for Healthcare Providers:  seriousbroker.it  This test is no t yet approved or cleared by the [place]  FDA and  has been authorized for detection and/or diagnosis of SARS-CoV-2 by FDA under an Emergency Use Authorization (EUA). This EUA will remain  in effect (meaning this test can be used) for the duration of the COVID-19 declaration under Section 564(b)(1) of the Act, 21 U.S.C.section 360bbb-3(b)(1), unless the authorization is terminated  or revoked sooner.       Influenza A by PCR NEGATIVE NEGATIVE Final   Influenza B by PCR NEGATIVE NEGATIVE Final    Comment: (NOTE) The Xpert Xpress SARS-CoV-2/FLU/RSV plus assay is intended as an aid in the diagnosis of influenza from Nasopharyngeal swab specimens and should not be used as a sole basis for treatment. Nasal washings and aspirates are unacceptable for Xpert Xpress SARS-CoV-2/FLU/RSV testing.  Fact Sheet for Patients: bloggercourse.com  Fact Sheet for Healthcare Providers: seriousbroker.it  This test is not yet approved or cleared by the [place]  FDA and has been authorized for detection and/or diagnosis of SARS-CoV-2 by FDA under an  Emergency Use Authorization (EUA). This EUA will remain in effect (meaning this test can be used) for the duration of the COVID-19 declaration under Section 564(b)(1) of the Act, 21 U.S.C. section 360bbb-3(b)(1), unless the authorization is terminated  or revoked.     Resp Syncytial Virus by PCR NEGATIVE NEGATIVE Final    Comment: (NOTE) Fact Sheet for Patients: bloggercourse.com  Fact Sheet for Healthcare Providers: seriousbroker.it  This test is not yet approved or cleared by the [place]  FDA and has been authorized for detection and/or diagnosis of SARS-CoV-2 by FDA under an Emergency Use Authorization (EUA). This EUA will remain in effect (meaning this test can be used) for the duration of the COVID-19 declaration under Section 564(b)(1) of the Act, 21 U.S.C. section 360bbb-3(b)(1), unless the authorization is terminated or revoked.  Performed at [facility], [address] [place], [place] [address]   Wet prep, genital     Status: None   Collection Time: 09/04/24  5:36 PM  Result Value Ref Range Status   Yeast Wet Prep HPF POC NONE SEEN NONE SEEN Final   Trich, Wet Prep NONE SEEN NONE SEEN Final   Clue Cells Wet Prep HPF POC NONE SEEN NONE SEEN Final   WBC, Wet Prep HPF POC <10 <10 Final   Sperm NONE SEEN  Final    Comment: Performed at [facility], [address] [place], [place] [address]   28 YO F presented to the ED with left sided flank pain and fever. Noted to have a 2-day history of diarrhea and dysuria. CTAP showed increasing size of adrenal gland nodule on left side and some inflammation around the bladder. STI testing was done and patient tested positive for Chlamydia. After speaking with Dr. Elnor, patient will be prescribed Doxycycline for treatment.  [x]  Patient discharged originally without antimicrobial agent and treatment is now indicated  New antibiotic prescription: Doxycycline 100 mg BID for 7 days  ED Provider: Dr. Jayson Elnor, DO   Kendra Morgan 09/07/2024, 8:27 AM Clinical Pharmacist Monday - Friday phone -  [phone] Saturday - Sunday phone - [phone]

## 2024-10-19 NOTE — Telephone Encounter (Signed)
 MUST BE  NPO ( my mouth)  4-6 hours prior  to exam .

## 2024-12-03 ENCOUNTER — Ambulatory Visit: Payer: Self-pay | Admitting: Internal Medicine

## 2024-12-13 ENCOUNTER — Ambulatory Visit: Payer: Self-pay | Admitting: Allergy and Immunology

## 2025-01-13 ENCOUNTER — Ambulatory Visit: Payer: Self-pay | Admitting: Allergy and Immunology
